# Patient Record
Sex: Male | Born: 1945 | Race: White | Hispanic: No | Marital: Married | Smoking: Current every day smoker
Health system: Southern US, Community
[De-identification: ages and names within clinical notes are randomized; demographics above are authoritative.]

## PROBLEM LIST (undated history)

## (undated) DIAGNOSIS — G4730 Sleep apnea, unspecified: Secondary | ICD-10-CM

## (undated) DIAGNOSIS — F4310 Post-traumatic stress disorder, unspecified: Secondary | ICD-10-CM

## (undated) DIAGNOSIS — Z961 Presence of intraocular lens: Secondary | ICD-10-CM

## (undated) DIAGNOSIS — I4729 Other ventricular tachycardia: Secondary | ICD-10-CM

## (undated) DIAGNOSIS — F0390 Unspecified dementia without behavioral disturbance: Secondary | ICD-10-CM

## (undated) DIAGNOSIS — G629 Polyneuropathy, unspecified: Secondary | ICD-10-CM

## (undated) DIAGNOSIS — H409 Unspecified glaucoma: Secondary | ICD-10-CM

## (undated) DIAGNOSIS — I259 Chronic ischemic heart disease, unspecified: Secondary | ICD-10-CM

## (undated) DIAGNOSIS — H269 Unspecified cataract: Secondary | ICD-10-CM

## (undated) DIAGNOSIS — D649 Anemia, unspecified: Secondary | ICD-10-CM

## (undated) DIAGNOSIS — I472 Ventricular tachycardia, unspecified: Secondary | ICD-10-CM

## (undated) DIAGNOSIS — E119 Type 2 diabetes mellitus without complications: Secondary | ICD-10-CM

## (undated) DIAGNOSIS — I2510 Atherosclerotic heart disease of native coronary artery without angina pectoris: Secondary | ICD-10-CM

## (undated) DIAGNOSIS — K219 Gastro-esophageal reflux disease without esophagitis: Secondary | ICD-10-CM

## (undated) DIAGNOSIS — H18513 Endothelial corneal dystrophy, bilateral: Secondary | ICD-10-CM

## (undated) DIAGNOSIS — G43909 Migraine, unspecified, not intractable, without status migrainosus: Secondary | ICD-10-CM

## (undated) DIAGNOSIS — F32A Depression, unspecified: Secondary | ICD-10-CM

## (undated) DIAGNOSIS — M4800 Spinal stenosis, site unspecified: Secondary | ICD-10-CM

## (undated) DIAGNOSIS — I10 Essential (primary) hypertension: Secondary | ICD-10-CM

## (undated) DIAGNOSIS — E785 Hyperlipidemia, unspecified: Secondary | ICD-10-CM

## (undated) HISTORY — DX: Spinal stenosis, site unspecified: M48.00

## (undated) HISTORY — DX: Other ventricular tachycardia: I47.29

## (undated) HISTORY — DX: Hyperlipidemia, unspecified: E78.5

## (undated) HISTORY — DX: Type 2 diabetes mellitus without complications: E11.9

## (undated) HISTORY — DX: Unspecified dementia, unspecified severity, without behavioral disturbance, psychotic disturbance, mood disturbance, and anxiety: F03.90

## (undated) HISTORY — DX: Essential (primary) hypertension: I10

## (undated) HISTORY — DX: Atherosclerotic heart disease of native coronary artery without angina pectoris: I25.10

## (undated) HISTORY — DX: Gastro-esophageal reflux disease without esophagitis: K21.9

## (undated) HISTORY — DX: Chronic ischemic heart disease, unspecified: I25.9

## (undated) HISTORY — DX: Sleep apnea, unspecified: G47.30

## (undated) HISTORY — DX: Anemia, unspecified: D64.9

## (undated) HISTORY — DX: Endothelial corneal dystrophy, bilateral: H18.513

## (undated) HISTORY — DX: Unspecified cataract: H26.9

## (undated) HISTORY — DX: Unspecified glaucoma: H40.9

## (undated) HISTORY — PX: HERNIA REPAIR: SHX51

## (undated) HISTORY — DX: Depression, unspecified: F32.A

## (undated) HISTORY — DX: Migraine, unspecified, not intractable, without status migrainosus: G43.909

## (undated) HISTORY — DX: Polyneuropathy, unspecified: G62.9

## (undated) HISTORY — PX: BACK SURGERY: SHX140

## (undated) HISTORY — DX: Ventricular tachycardia, unspecified: I47.20

## (undated) HISTORY — DX: Post-traumatic stress disorder, unspecified: F43.10

## (undated) HISTORY — DX: Presence of intraocular lens: Z96.1

## (undated) HISTORY — PX: SHOULDER ARTHROSCOPY: SHX128

---

## 2000-11-01 ENCOUNTER — Ambulatory Visit (HOSPITAL_BASED_OUTPATIENT_CLINIC_OR_DEPARTMENT_OTHER): Admission: RE | Admit: 2000-11-01 | Discharge: 2000-11-01 | Payer: Self-pay | Admitting: Orthopaedic Surgery

## 2011-02-15 ENCOUNTER — Other Ambulatory Visit: Payer: Self-pay | Admitting: Family Medicine

## 2011-02-15 DIAGNOSIS — M545 Low back pain: Secondary | ICD-10-CM

## 2011-02-16 ENCOUNTER — Ambulatory Visit
Admission: RE | Admit: 2011-02-16 | Discharge: 2011-02-16 | Disposition: A | Discharge status: Home / Self Care | Payer: PRIVATE HEALTH INSURANCE | Source: Ambulatory Visit | Attending: Family Medicine | Admitting: Family Medicine

## 2011-02-16 VITALS — BP 100/74 | HR 57

## 2011-02-16 DIAGNOSIS — M545 Low back pain: Secondary | ICD-10-CM

## 2011-02-16 DIAGNOSIS — M549 Dorsalgia, unspecified: Secondary | ICD-10-CM

## 2011-02-16 MED ORDER — IOHEXOL 180 MG/ML  SOLN
15.0000 mL | Freq: Once | INTRAMUSCULAR | Status: AC | PRN
  Administered 2011-02-16: 15 mL via INTRATHECAL

## 2011-02-16 MED ORDER — DIAZEPAM 2 MG PO TABS
10.0000 mg | ORAL_TABLET | Freq: Once | ORAL | Status: AC
  Administered 2011-02-16: 10 mg via ORAL

## 2011-02-16 NOTE — Progress Notes (Signed)
Pt returned post myelo to nursing area. Comfortable and taking po's well.

## 2011-02-17 ENCOUNTER — Telehealth: Payer: Self-pay | Admitting: Radiology

## 2011-02-17 NOTE — Telephone Encounter (Signed)
Pt called to schedule f/u for his myelo. Called him back and told him to call Dr. Deatra James for that appointment.

## 2012-07-26 IMAGING — RF DG MYELOGRAM LUMBAR
12 of 16 series · 12 of 16 positions shown · IV contrast (omnipaque)
Comparison: None.

CLINICAL DATA: Bilateral lower extremity pain, left greater than
right.  Status post previous lumbar fusion.

MYELOGRAM INJECTION
TECHNIQUE: Informed consent was obtained from the patient prior to
the procedure, including potential complications of headache,
allergy, infection and pain.  A timeout procedure was performed.
With the patient prone, the lower back was prepped with Betadine.
1% Lidocaine was used for local anesthesia.  Lumbar puncture was
performed at the right paramidline L3-4 level using a 22 gauge
needle with return of clear CSF.  15 ml of Omnipaque 056was
injected into the subarachnoid space .
TECHNIQUE: Following injection of intrathecal Omnipaque contrast,
spine imaging in multiple projections was performed using
fluoroscopy.
Fluoroscopy Time: 1.02 minutes.
TECHNIQUE: CT imaging of the lumbar spine was performed after
intrathecal contrast administration.  Multiplanar CT image
reconstructions were also generated.

[Series 1: (hospital) · 1 of 1 slices shown]
[im 1/1]
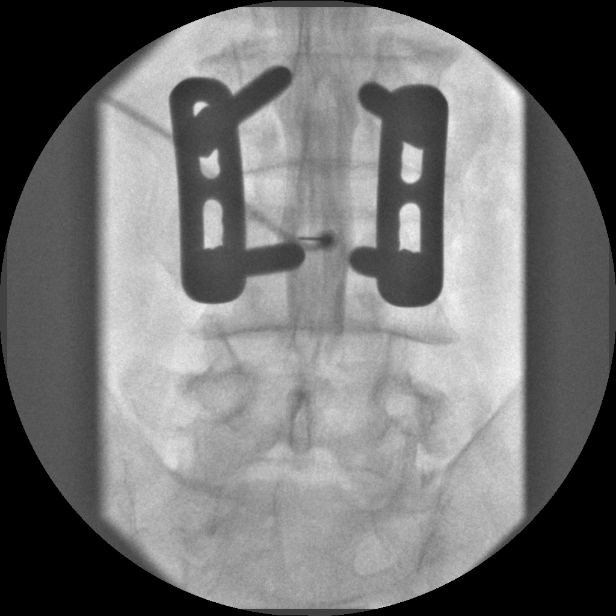

[Series 3: myelogram  white · 1 of 1 slices shown (1 of 11)]
[im 1/1]
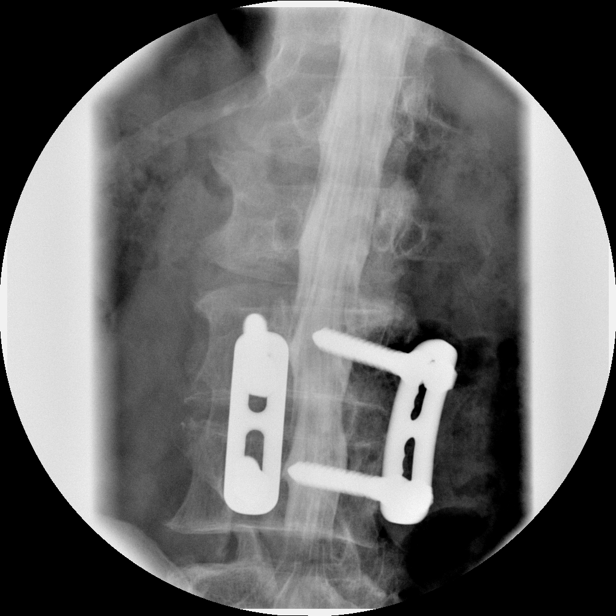

[Series 5: myelogram  white · 1 of 1 slices shown (2 of 11)]
[im 1/1]
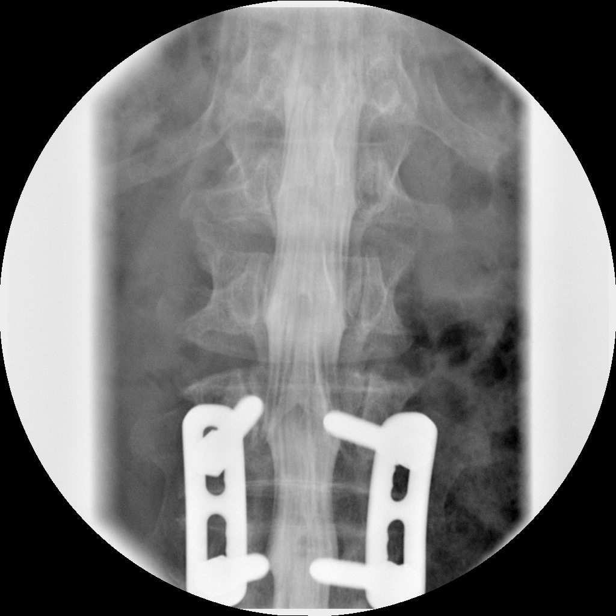

[Series 8: myelogram  white · 1 of 1 slices shown (3 of 11)]
[im 1/1]
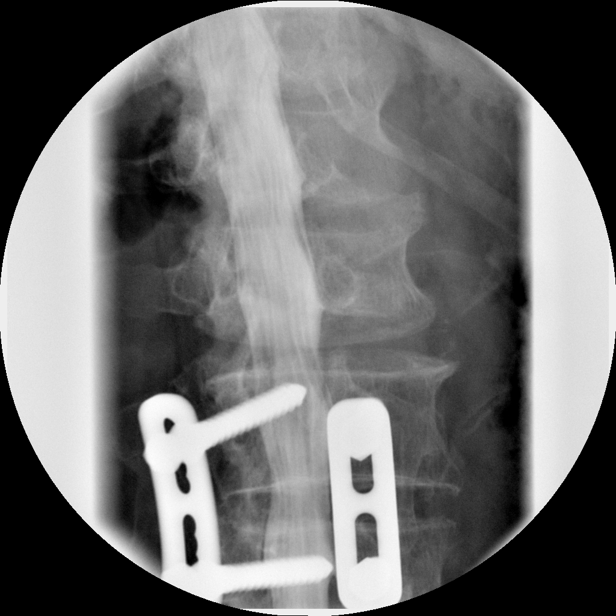

[Series 11: myelogram  white · 1 of 1 slices shown (4 of 11)]
[im 1/1]
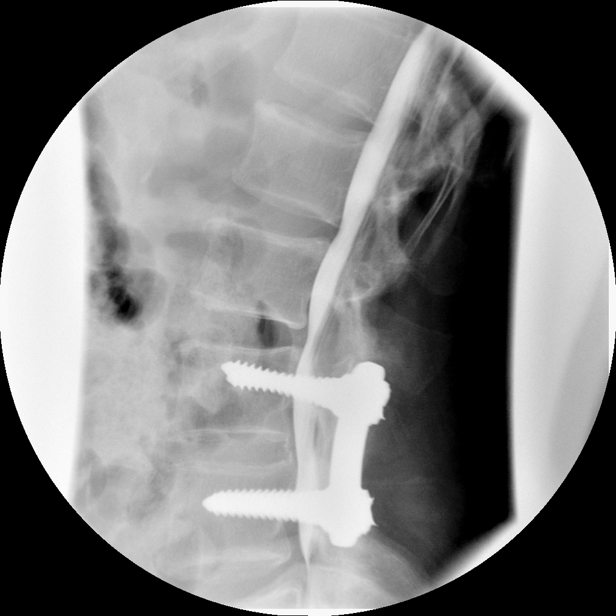

[Series 12: myelogram  white · 1 of 1 slices shown (5 of 11)]
[im 1/1]
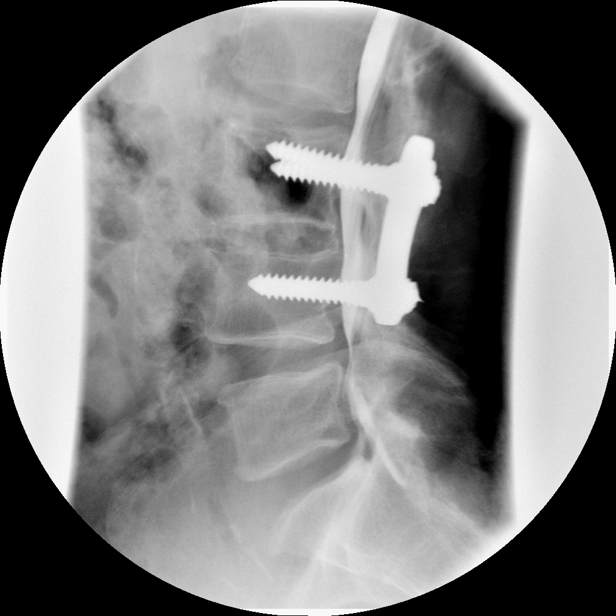

[Series 13: myelogram  white · 1 of 1 slices shown (6 of 11)]
[im 1/1]
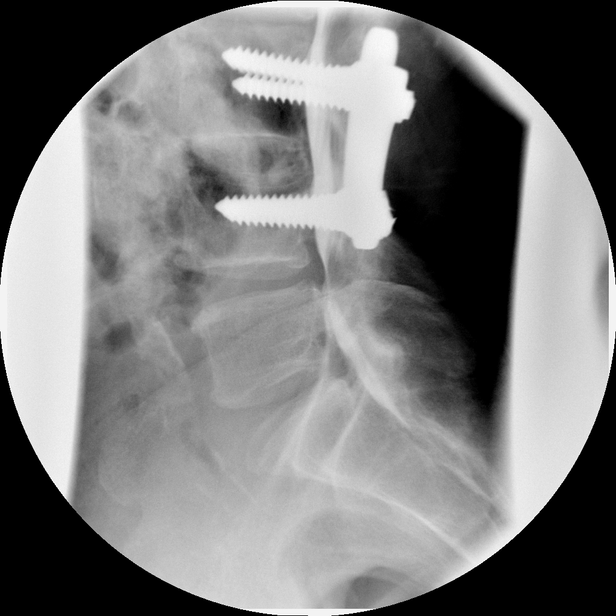

[Series 15: myelogram  white · 1 of 1 slices shown (7 of 11)]
[im 1/1]
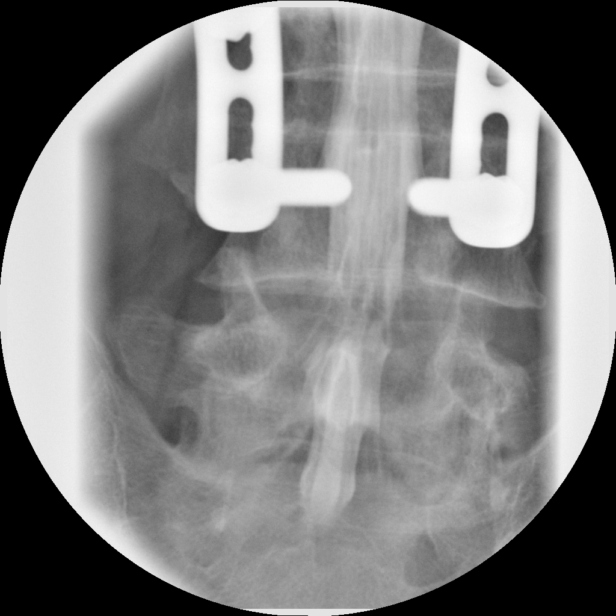

[Series 16: myelogram  white · 1 of 1 slices shown (8 of 11)]
[im 1/1]
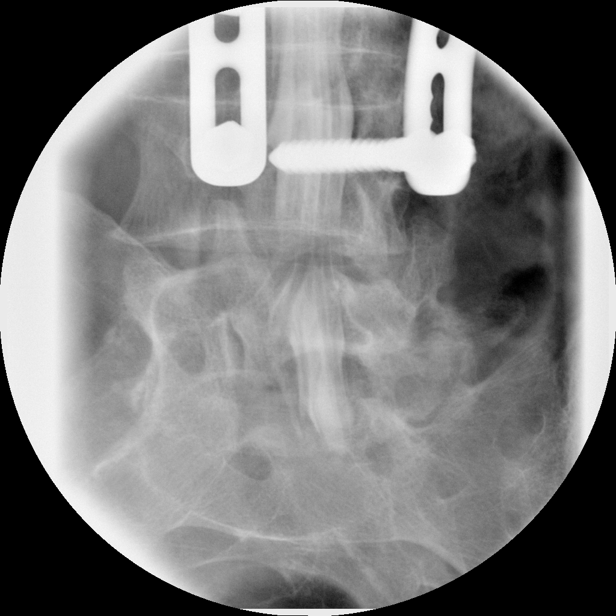

[Series 17: myelogram  white · 1 of 1 slices shown (9 of 11)]
[im 1/1]
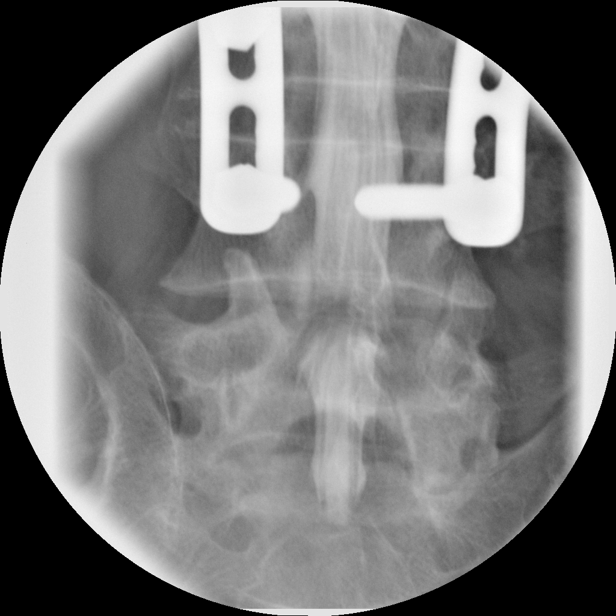

[Series 19: myelogram  white · 1 of 1 slices shown (10 of 11)]
[im 1/1]
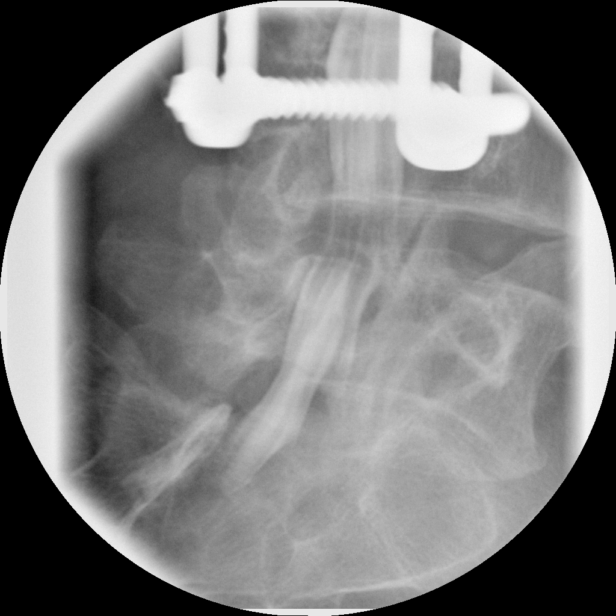

[Series 20: myelogram  white · 1 of 1 slices shown (11 of 11)]
[im 1/1]
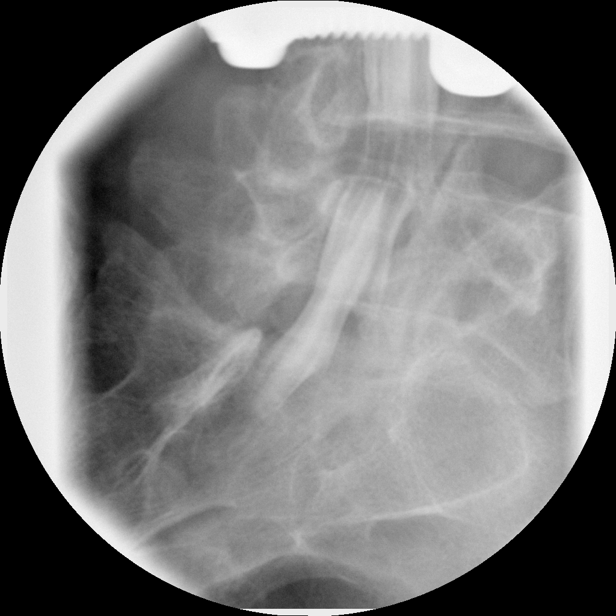

[12 of 16 positions shown; findings below may reference images not displayed]

IMPRESSION: Successful injection of  intrathecal contrast for myelography.

MYELOGRAM LUMBAR
FINDINGS: The patient is status post posterior fusion at L3-4.
Five non-rib bearing lumbar type vertebral bodies are present.  The
upper lumbar nerve roots fill normally.  Vertebral body heights and
alignment are maintained.  Slight disc bulging is present at L2-3.
There is no significant stenosis at the fused segment, L3-4.

A waist deformity is present at L4-5, suggesting adjacent level
stenosis.  There is medial deviation of the traversing L5 nerve
roots.  There is impact greater left than right.

The upright images demonstrate slight increase in disc bulging and
possible narrowing at L2-3.  Alignment is maintained at L4-5 with
slight increase in the disc bulge.  There is no abnormal motion
with flexion or extension.  The fused segments are stable.
IMPRESSION: 1.  Status post posterior fusion at L3-4.
2.  Adjacent level stenosis at L4-5 with left greater than right
lateral recess narrowing.
3.  Mild narrowing at L2-3 is most evident on the upright images.
4.  Slight increase in disc bulging at L4-5 the upright images as
well without abnormal motion during flexion or extension.


CT MYELOGRAPHY LUMBAR SPINE
FINDINGS: The lumbar spine is imaged from T11-12 through S2-3.
The patient is status post posterior fusion with pedicle screw and
rod fixation at L3-4.  There is solid osseous fusion across the
posterior elements.  There may be some bridging bone at the
posterior aspect of the disc space.  The patient does not appear to
have had a discectomy.  Limited imaging of the abdomen is
unremarkable.  The individual disc levels are as follows.

The disc levels at T12-L1 and above are normal.

L1-2:  Minimal disc bulging is present. Mild facet hypertrophy is
evident as well. No significant stenosis is present.

L2-3:  Broad-based disc bulging is present.  Moderate facet
hypertrophy and ligamentum flavum thickening is present.  There is
a vacuum phenomenon within the facet joints bilaterally. Mild right
lateral recess and foraminal narrowing is present.

L3-4:  The patient is status post fusion at this level.  No
significant central or foraminal stenosis is present.  A wide
laminectomy was performed.

L4-5:  Moderate central canal stenosis is secondary to broad-based
disc bulging and advanced facet hypertrophy.  A vacuum phenomenon
is evident within the facet joints bilaterally.  The lateral recess
narrowing is worse on the left.  Mild foraminal narrowing is seen
bilaterally.

L5-S1:  Mild broad-based disc bulging is present.  The facet
hypertrophy contributes to mild lateral recess and foraminal
narrowing bilaterally.
IMPRESSION: 1.  Status post laminectomy and posterior fusion at L3-4.  No
residual stenosis is evident.
2.  Moderate central canal stenosis at L4-5 with mild foraminal
narrowing bilaterally.  The lateral recess narrowing is worse on
the left.
3.  Mild central and foraminal narrowing bilaterally at L5-S1.
4.  Advanced facet hypertrophy at L2-3 and L4-5.
5.  Mild right lateral recess and foraminal narrowing at L2-3.

## 2021-03-30 ENCOUNTER — Ambulatory Visit: Payer: PRIVATE HEALTH INSURANCE | Attending: Internal Medicine

## 2021-03-30 DIAGNOSIS — Z23 Encounter for immunization: Secondary | ICD-10-CM

## 2021-03-30 NOTE — Progress Notes (Signed)
   Covid-19 Vaccination Clinic  Name:  Benjamin Boyle    MRN: [id number] DOB: Oct 31, 1945  03/30/2021  [name] was observed post Covid-19 immunization for 15 minutes without incident. He was provided with Vaccine Information Sheet and instruction to access the V-Safe system.   [name] was instructed to call [phone] with any severe reactions post vaccine: Difficulty breathing  Swelling of face and throat  A fast heartbeat  A bad rash all over body  Dizziness and weakness

## 2021-04-07 ENCOUNTER — Other Ambulatory Visit (HOSPITAL_BASED_OUTPATIENT_CLINIC_OR_DEPARTMENT_OTHER): Payer: Self-pay

## 2021-04-07 MED ORDER — COVID-19MRNA BIVAL VACC PFIZER 30 MCG/0.3ML IM SUSP
INTRAMUSCULAR | 0 refills | Status: DC
  Filled 2021-04-07: qty 0.3, 1d supply, fill #0

## 2021-08-26 ENCOUNTER — Inpatient Hospital Stay: Payer: PRIVATE HEALTH INSURANCE

## 2021-08-26 ENCOUNTER — Encounter: Payer: PRIVATE HEALTH INSURANCE | Admitting: Hematology and Oncology

## 2021-08-27 ENCOUNTER — Other Ambulatory Visit: Payer: Self-pay | Admitting: Hematology and Oncology

## 2021-08-27 DIAGNOSIS — D509 Iron deficiency anemia, unspecified: Secondary | ICD-10-CM

## 2021-08-28 ENCOUNTER — Encounter: Payer: Self-pay | Admitting: Hematology and Oncology

## 2021-08-28 ENCOUNTER — Inpatient Hospital Stay: Payer: No Typology Code available for payment source | Attending: Hematology and Oncology

## 2021-08-28 ENCOUNTER — Other Ambulatory Visit: Payer: Self-pay

## 2021-08-28 ENCOUNTER — Inpatient Hospital Stay (INDEPENDENT_AMBULATORY_CARE_PROVIDER_SITE_OTHER): Payer: No Typology Code available for payment source | Admitting: Hematology and Oncology

## 2021-08-28 DIAGNOSIS — D509 Iron deficiency anemia, unspecified: Secondary | ICD-10-CM | POA: Insufficient documentation

## 2021-08-28 LAB — CBC: RBC: 3.72 — AB (ref 3.87–5.11)

## 2021-08-28 LAB — CBC AND DIFFERENTIAL
HCT: 32 — AB (ref 41–53)
Hemoglobin: 10.3 — AB (ref 13.5–17.5)
Neutrophils Absolute: 4.54
Platelets: 254 10*3/uL (ref 150–400)
WBC: 7.1

## 2021-08-28 LAB — IRON AND TIBC
Iron: 30 ug/dL — ABNORMAL LOW (ref 45–182)
Saturation Ratios: 7 % — ABNORMAL LOW (ref 17.9–39.5)
TIBC: 438 ug/dL (ref 250–450)
UIBC: 408 ug/dL

## 2021-08-28 LAB — FERRITIN: Ferritin: 9 ng/mL — ABNORMAL LOW (ref 24–336)

## 2021-08-28 NOTE — Progress Notes (Cosign Needed)
[facility]  [address] [place],  [place]  [address] [phone]  Clinic Day:  08/28/2021  Referring physician: [place]:    HISTORY OF PRESENT ILLNESS:  Benjamin Boyle is a 76 y.o. male with a history of iron deficiency anemia who is referred in consultation by [facility] for assessment and management. He has been managed for quite some time with oral iron; however, he has significant constipation with it. He was referred to [place] for possible IV iron infusions.   Today, he denies fever, chills, nausea or vomiting. He denies shortness of breath, chest pain or cough. He denies current issues with bowel or bladder. His appetite is good and his weight is stable. 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 Surgical history includes hernia repair, back surgeries x 2 and right shoulder surgery. Family medical history includes diabetes and heart disease.    REVIEW OF SYSTEMS:  Review of Systems  Constitutional:  Negative for appetite change, chills, diaphoresis, fatigue, fever and unexpected weight change.  HENT:   Negative for hearing loss, lump/mass, mouth sores, nosebleeds, sore throat, tinnitus, trouble swallowing and voice change.   Eyes:  Negative for eye problems and icterus.  Respiratory:  Negative for chest tightness, cough, hemoptysis, shortness of breath and wheezing.   Cardiovascular:  Negative for chest pain, leg swelling and palpitations.  Gastrointestinal:  Positive for constipation. Negative for abdominal distention, abdominal pain, blood in stool, diarrhea, nausea, rectal pain and vomiting.  Endocrine: Negative for hot flashes.  Genitourinary:  Negative for bladder incontinence, difficulty urinating, dyspareunia, dysuria, frequency,  hematuria and nocturia.   Musculoskeletal:  Positive for arthralgias, back pain, gait problem and myalgias. Negative for flank pain, neck pain and neck stiffness.  Skin:  Negative for itching, rash and wound.  Neurological:  Positive for extremity weakness and gait problem. Negative for dizziness, headaches, light-headedness, numbness, seizures and speech difficulty.  Hematological:  Negative for adenopathy. Does not bruise/bleed easily.  Psychiatric/Behavioral:  Negative for confusion, decreased concentration, depression, sleep disturbance and suicidal ideas. The patient is not nervous/anxious.     VITALS:  Blood pressure (!) 143/68, pulse 60, temperature 97.7 F (36.5 C), temperature source Oral, resp. rate 18, height 5\' 7"  (1.702 m), weight 154 lb 3.2 oz (69.9 kg), SpO2 95 %.  Wt Readings from Last 3 Encounters:  08/28/21 154 lb 3.2 oz (69.9 kg)    Body mass index is 24.15 kg/m.  Performance status (ECOG): 1 - Symptomatic but completely ambulatory  PHYSICAL EXAM:  Physical Exam Constitutional:      General: He is not in acute distress.    Appearance: Normal appearance. He is normal weight. He is not ill-appearing, toxic-appearing or diaphoretic.  HENT:     Head: Normocephalic and atraumatic.     Right Ear: Tympanic membrane normal.     Left Ear: Tympanic membrane normal.     Nose: Nose normal. No congestion or rhinorrhea.     Mouth/Throat:     Mouth: Mucous membranes are moist.     Pharynx: Oropharynx is clear. No oropharyngeal exudate or posterior oropharyngeal erythema.  Eyes:     General: No scleral icterus.       Right eye: No discharge.        Left eye: No discharge.     Extraocular Movements: Extraocular movements intact.  Conjunctiva/sclera: Conjunctivae normal.     Pupils: Pupils are equal, round, and reactive to light.  Neck:     Vascular: No carotid bruit.  Cardiovascular:     Rate and Rhythm: Normal rate and regular rhythm.     Heart sounds: No murmur  heard.   No friction rub. No gallop.  Pulmonary:     Effort: Pulmonary effort is normal. No respiratory distress.     Breath sounds: Normal breath sounds. No stridor. No wheezing, rhonchi or rales.  Chest:     Chest wall: No tenderness.  Abdominal:     General: Abdomen is flat. Bowel sounds are normal. There is no distension.     Palpations: There is no mass.     Tenderness: There is no abdominal tenderness. There is no right CVA tenderness, left CVA tenderness, guarding or rebound.     Hernia: No hernia is present.  Musculoskeletal:        General: No swelling, tenderness, deformity or signs of injury. Normal range of motion.     Cervical back: Normal range of motion and neck supple. No rigidity or tenderness.     Right lower leg: No edema.     Left lower leg: No edema.  Lymphadenopathy:     Cervical: No cervical adenopathy.  Skin:    General: Skin is warm and dry.     Capillary Refill: Capillary refill takes less than 2 seconds.     Coloration: Skin is not jaundiced or pale.     Findings: No bruising, erythema, lesion or rash.  Neurological:     General: No focal deficit present.     Mental Status: He is alert and oriented to person, place, and time. Mental status is at baseline.     Cranial Nerves: No cranial nerve deficit.     Sensory: No sensory deficit.     Motor: Weakness present.     Coordination: Coordination abnormal.     Gait: Gait abnormal.     Deep Tendon Reflexes: Reflexes normal.  Psychiatric:        Mood and Affect: Mood normal.        Behavior: Behavior normal.        Thought Content: Thought content normal.        Judgment: Judgment normal.     LABS:   CBC Latest Ref Rng & Units 08/28/2021  WBC - 7.1  Hemoglobin 13.5 - 17.5 10.3(A)  Hematocrit 41 - 53 32(A)  Platelets 150 - 400 K/uL 254   No flowsheet data found.   No results found for: CEA1 / No results found for: CEA1 No results found for: PSA1 No results found for: [id number] No results found  for: CAN125  No results found for: TOTALPROTELP, ALBUMINELP, A1GS, A2GS, BETS, BETA2SER, GAMS, MSPIKE, SPEI No results found for: TIBC, FERRITIN, IRONPCTSAT No results found for: LDH  STUDIES:  No results found.    HISTORY:  No past medical history on file.    No family history on file.  Social History:  has no history on file for tobacco use, alcohol use, and drug use.The patient is accompanied by daughter today.  Allergies:  Allergies  Allergen Reactions   Codeine Nausea And Vomiting   Donepezil Diarrhea   Gabapentin     Other reaction(s): Memory impairment   Glipizide     Other reaction(s): Hypoglycemia   Zomig [Zolmitriptan]     Current Medications: Current Outpatient Medications  Medication Sig Dispense Refill   Alogliptin Benzoate  25 MG TABS TAKE ONE-HALF TABLET BY MOUTH DAILY FOR DIABETES     amLODipine (NORVASC) 10 MG tablet TAKE ONE-HALF TABLET BY MOUTH DAILY FOR HEART IN PLACE OF METOPROLOL     atorvastatin (LIPITOR) 80 MG tablet TAKE ONE-HALF TABLET BY MOUTH AT BEDTIME FOR CHOLESTEROL     cetirizine (ZYRTEC) 10 MG tablet TAKE ONE TABLET BY MOUTH EVERY MORNING FOR CONGESTION- TAKE FOR 2  WEEKS THEN STOP IF CLEARED     clonazePAM (KLONOPIN) 0.5 MG tablet TAKE TWO AND ONE-HALF TABLETS BY MOUTH AT BEDTIME - NEW, FOR JUMPING LEGS (MYOCLONUS)     losartan (COZAAR) 100 MG tablet TAKE ONE TABLET BY MOUTH EVERY MORNING FOR BLOOD PRESSURE; TAKE IN PLACE OF LISINOPRIL;  STOP THE LISINOPRIL     Menthol-Methyl Salicylate (THERA-GESIC) 0.5-15 % CREA APPLY SMALL AMOUNT TO AFFECTED AREA EVERY FOUR HOURS FOR NECK PAIN     methocarbamol (ROBAXIN) 750 MG tablet TAKE ONE TABLET BY MOUTH AT BEDTIME NECK PAIN     naproxen (NAPROSYN) 500 MG tablet TAKE ONE TABLET BY MOUTH TWICE A DAY AS NEEDED (TAKE WITH FOOD) FOR PAIN     PARoxetine (PAXIL) 40 MG tablet TAKE ONE AND ONE-HALF TABLETS BY MOUTH DAILY FOR MENTAL HEALTH     pramipexole (MIRAPEX) 1 MG tablet TAKE ONE TABLET BY MOUTH AT  BEDTIME FOR RESTLESS LEG SYNDROME, NOT A DOSE CHANGE     pregabalin (LYRICA) 100 MG capsule TAKE ONE CAPSULE BY MOUTH TWICE A DAY - INCREASED FROM 75 MG TWICE DAILY     rivastigmine (EXELON) 6 MG capsule TAKE ONE CAPSULE BY MOUTH TWICE A DAY WITH MEALS TO SLOW MEMORY LOSS. TAKE IN PLACE OF DONEPEZIL. NO CHANGE     sildenafil (VIAGRA) 100 MG tablet TAKE ONE-HALF TABLET BY MOUTH AS DIRECTED (TAKE 1 HOUR PRIOR TO SEXUAL ACTIVITY *DO NOT EXCEED 1 DOSE PER 24 HOUR PERIOD*) DO NOT TAKE SILDENAFIL ON DAYS THAT YOU TAKE TAMSULOSIN.     sodium chloride (MURO 128) 5 % ophthalmic ointment APPLY THIN RIBBON TO EACH EYE TWICE A DAY FOR CORNEAL EDEMA     tamsulosin (FLOMAX) 0.4 MG CAPS capsule TAKE ONE CAPSULE BY MOUTH ONCE A DAY     traZODone (DESYREL) 50 MG tablet Take 1 tablet by mouth at bedtime.     vitamin B-12 (CYANOCOBALAMIN) 500 MCG tablet Take 1 tablet by mouth daily.     acetaminophen (TYLENOL) 500 MG tablet Take by mouth.     COVID-19 mRNA bivalent vaccine, Pfizer, injection Inject into the muscle. 0.3 mL 0   metFORMIN (GLUCOPHAGE-XR) 500 MG 24 hr tablet Take by mouth.     No current facility-administered medications for this visit.     ASSESSMENT & PLAN:   Assessment:  Benjamin Boyle is a 76 y.o. male with history of iron deficiency anemia who is intolerant of oral iron. He reports severe constipation with oral iron and was referred by the [facility] for IV iron infusions. CBC today reveals hemoglobin 10.3. He is agreeable to iron infusion. He is scheduled for corneal transplant next week, so we will delay until the week of the 20th.  Plan: 1.  We will schedule IV Feraheme for the week of March 20th and have him return to clinic 4 weeks after final dose for evaluation.  I discussed the assessment and treatment plan with the patient.  The patient was provided an opportunity to ask questions and all were answered.  The patient agreed with the plan and demonstrated an  understanding of the instructions.   The patient was advised to call back if the symptoms worsen or if the condition fails to improve as anticipated.  Thank you for the opportunity      [name], NP

## 2021-09-04 ENCOUNTER — Encounter: Payer: Self-pay | Admitting: Hematology and Oncology

## 2021-09-04 NOTE — Addendum Note (Signed)
Addended by: Juanetta Beets on: 09/04/2021 09:29 AM ? ? Modules accepted: Orders ? ?

## 2021-09-07 ENCOUNTER — Inpatient Hospital Stay: Payer: No Typology Code available for payment source

## 2021-09-07 ENCOUNTER — Other Ambulatory Visit: Payer: Self-pay

## 2021-09-07 VITALS — BP 144/77 | HR 66 | Temp 97.9°F | Resp 18 | Ht 67.0 in | Wt 154.0 lb

## 2021-09-07 DIAGNOSIS — D509 Iron deficiency anemia, unspecified: Secondary | ICD-10-CM | POA: Diagnosis not present

## 2021-09-07 MED ORDER — SODIUM CHLORIDE 0.9 % IV SOLN
510.0000 mg | Freq: Once | INTRAVENOUS | Status: AC
  Administered 2021-09-07: 510 mg via INTRAVENOUS
  Filled 2021-09-07: qty 510

## 2021-09-07 MED ORDER — SODIUM CHLORIDE 0.9 % IV SOLN: Freq: Once | INTRAVENOUS | Status: AC

## 2021-09-07 NOTE — Patient Instructions (Signed)

## 2021-09-14 ENCOUNTER — Inpatient Hospital Stay: Payer: No Typology Code available for payment source

## 2021-09-14 ENCOUNTER — Other Ambulatory Visit: Payer: Self-pay

## 2021-09-14 VITALS — BP 134/71 | HR 52 | Temp 98.0°F | Resp 18 | Wt 156.0 lb

## 2021-09-14 DIAGNOSIS — D509 Iron deficiency anemia, unspecified: Secondary | ICD-10-CM | POA: Diagnosis not present

## 2021-09-14 MED ORDER — SODIUM CHLORIDE 0.9 % IV SOLN: Freq: Once | INTRAVENOUS | Status: AC

## 2021-09-14 MED ORDER — SODIUM CHLORIDE 0.9 % IV SOLN
510.0000 mg | Freq: Once | INTRAVENOUS | Status: AC
  Administered 2021-09-14: 510 mg via INTRAVENOUS
  Filled 2021-09-14: qty 510

## 2021-09-14 NOTE — Patient Instructions (Signed)

## 2021-09-24 ENCOUNTER — Encounter: Payer: Self-pay | Admitting: Hematology and Oncology

## 2021-10-09 ENCOUNTER — Inpatient Hospital Stay: Payer: No Typology Code available for payment source

## 2021-10-09 ENCOUNTER — Other Ambulatory Visit: Payer: Self-pay | Admitting: Hematology and Oncology

## 2021-10-09 ENCOUNTER — Inpatient Hospital Stay
Payer: No Typology Code available for payment source | Attending: Hematology and Oncology | Admitting: Hematology and Oncology

## 2021-10-09 ENCOUNTER — Other Ambulatory Visit: Payer: Self-pay

## 2021-10-09 ENCOUNTER — Encounter: Payer: Self-pay | Admitting: Hematology and Oncology

## 2021-10-09 DIAGNOSIS — D509 Iron deficiency anemia, unspecified: Secondary | ICD-10-CM

## 2021-10-09 LAB — CBC AND DIFFERENTIAL
HCT: 36 — AB (ref 41–53)
Hemoglobin: 11.5 — AB (ref 13.5–17.5)
Neutrophils Absolute: 4.82
Platelets: 231 10*3/uL (ref 150–400)
WBC: 7.3

## 2021-10-09 LAB — IRON AND TIBC
Iron: 80 ug/dL (ref 45–182)
Saturation Ratios: 28 % (ref 17.9–39.5)
TIBC: 283 ug/dL (ref 250–450)
UIBC: 203 ug/dL

## 2021-10-09 LAB — FERRITIN: Ferritin: 187 ng/mL (ref 24–336)

## 2021-10-09 LAB — CBC
MCV: 89 (ref 76–111)
RBC: 4.05 (ref 3.87–5.11)

## 2021-10-09 NOTE — Progress Notes (Cosign Needed)
?Patient Care Team: ?Clinic, [facility] as PCP - General ? ?Clinic Day:  10/09/2021 ? ?Referring physician: [name], NP ? ?ASSESSMENT & PLAN:  ? ?Assessment & Plan: ?Iron deficiency anemia ?A 76 y.o. male with history of iron deficiency anemia who is intolerant of oral iron. He reports severe constipation with oral iron and was referred by the [facility] for IV iron infusions. He received IV Feraheme 4 weeks ago without complications. Today, hemoglobin is 11.5 and he states feeling much better. Symptoms of fatigue and shortness of breath have resolved. He will return to clinic in 3 months for repeat evaluation.   ? ?The patient understands the plans discussed today and is in agreement with them.  He knows to contact our office if he develops concerns prior to his next appointment. ? ? ? ?[name], NP  ?[facility] [address] ?[place] [address] ?Dept: [phone] ?Dept Fax: [phone]  ? ?No orders of the defined types were placed in this encounter. ?  ? ? ?CHIEF COMPLAINT:  ?CC: A 76 year old male with iron deficiency anemia here for 4 week evaluation ? ?Current Treatment:  Surveillance ? ?INTERVAL HISTORY:  ?Benjamin Boyle is here today for repeat clinical assessment. He denies fevers or chills. He denies pain. His appetite is good. His weight has been stable. ? ?I have reviewed the past medical history, past surgical history, social history and family history with the patient and they are unchanged from previous note. ? ?ALLERGIES:  is allergic to codeine, donepezil, gabapentin, glipizide, and zomig [zolmitriptan]. ? ?MEDICATIONS:  ?Current Outpatient Medications  ?Medication Sig Dispense Refill  ? acetaminophen (TYLENOL) 500 MG tablet Take by mouth.    ? Alogliptin Benzoate 25 MG TABS TAKE ONE-HALF TABLET BY MOUTH DAILY FOR DIABETES    ? amLODipine (NORVASC) 10 MG tablet TAKE ONE-HALF TABLET BY MOUTH DAILY FOR HEART IN PLACE OF  METOPROLOL    ? atorvastatin (LIPITOR) 80 MG tablet TAKE ONE-HALF TABLET BY MOUTH AT BEDTIME FOR CHOLESTEROL    ? cetirizine (ZYRTEC) 10 MG tablet TAKE ONE TABLET BY MOUTH EVERY MORNING FOR CONGESTION- TAKE FOR 2  WEEKS THEN STOP IF CLEARED    ? clonazePAM (KLONOPIN) 0.5 MG tablet TAKE TWO AND ONE-HALF TABLETS BY MOUTH AT BEDTIME - NEW, FOR JUMPING LEGS (MYOCLONUS)    ? COVID-19 mRNA bivalent vaccine, Pfizer, injection Inject into the muscle. 0.3 mL 0  ? cyanocobalamin 100 MCG tablet take 1 tablet by oral route every day    ? HYDROcodone-acetaminophen (NORCO/VICODIN) 5-325 MG tablet Take 1 tablet by mouth every 6 (six) hours as needed.    ? losartan (COZAAR) 100 MG tablet TAKE ONE TABLET BY MOUTH EVERY MORNING FOR BLOOD PRESSURE; TAKE IN PLACE OF LISINOPRIL;  STOP THE LISINOPRIL    ? memantine (NAMENDA) 10 MG tablet take 2 tablets by oral route every day at bedtime to slow memory loss    ? Menthol-Methyl Salicylate (THERA-GESIC) 0.5-15 % CREA APPLY SMALL AMOUNT TO AFFECTED AREA EVERY FOUR HOURS FOR NECK PAIN    ? metFORMIN (GLUCOPHAGE-XR) 500 MG 24 hr tablet Take by mouth.    ? methocarbamol (ROBAXIN) 750 MG tablet TAKE ONE TABLET BY MOUTH AT BEDTIME NECK PAIN    ? moxifloxacin (VIGAMOX) 0.5 % ophthalmic solution INSTILL 1 DROP IN LEFT EYE FOUR TIMES A DAY FOR EYE PROCEDURE    ? naproxen (NAPROSYN) 500 MG tablet TAKE ONE TABLET BY MOUTH TWICE A DAY  AS NEEDED (TAKE WITH FOOD) FOR PAIN    ? neomycin-polymyxin-dexameth (MAXITROL) 0.1 % OINT APPLY THIN RIBBON TO LEFT EYE AT BEDTIME FOR EYE PROCEDURE    ? ondansetron (ZOFRAN-ODT) 4 MG disintegrating tablet Take 4 mg by mouth every 8 (eight) hours as needed.    ? PARoxetine (PAXIL) 40 MG tablet TAKE ONE AND ONE-HALF TABLETS BY MOUTH DAILY FOR MENTAL HEALTH    ? pramipexole (MIRAPEX) 1 MG tablet TAKE ONE TABLET BY MOUTH AT BEDTIME FOR RESTLESS LEG SYNDROME, NOT A DOSE CHANGE    ? prednisoLONE acetate (PRED FORTE) 1 % ophthalmic suspension INSTILL 1 DROP IN LEFT EYE FOUR  TIMES A DAY PLEASE DISPENSE 10 ML BOTTLE, IDEALLY PINK TOP IF AVAILABLE.    ? pregabalin (LYRICA) 100 MG capsule TAKE ONE CAPSULE BY MOUTH TWICE A DAY - INCREASED FROM 75 MG TWICE DAILY    ? rivastigmine (EXELON) 6 MG capsule TAKE ONE CAPSULE BY MOUTH TWICE A DAY WITH MEALS TO SLOW MEMORY LOSS. TAKE IN PLACE OF DONEPEZIL. NO CHANGE    ? sildenafil (VIAGRA) 100 MG tablet TAKE ONE-HALF TABLET BY MOUTH AS DIRECTED (TAKE 1 HOUR PRIOR TO SEXUAL ACTIVITY *DO NOT EXCEED 1 DOSE PER 24 HOUR PERIOD*) DO NOT TAKE SILDENAFIL ON DAYS THAT YOU TAKE TAMSULOSIN.    ? sodium chloride (MURO 128) 5 % ophthalmic ointment APPLY THIN RIBBON TO EACH EYE TWICE A DAY FOR CORNEAL EDEMA    ? tamsulosin (FLOMAX) 0.4 MG CAPS capsule TAKE ONE CAPSULE BY MOUTH ONCE A DAY    ? traZODone (DESYREL) 50 MG tablet Take 1 tablet by mouth at bedtime.    ? vitamin B-12 (CYANOCOBALAMIN) 500 MCG tablet Take 1 tablet by mouth daily.    ? ?No current facility-administered medications for this visit.  ? ? ?HISTORY OF PRESENT ILLNESS:  ? ?Oncology History  ? No history exists.  ?  ? ? ?REVIEW OF SYSTEMS:  ? ?Constitutional: Denies fevers, chills or abnormal weight loss ?Eyes: Denies blurriness of vision ?Ears, nose, mouth, throat, and face: Denies mucositis or sore throat ?Respiratory: Denies cough, dyspnea or wheezes ?Cardiovascular: Denies palpitation, chest discomfort or lower extremity swelling ?Gastrointestinal:  Denies nausea, heartburn or change in bowel habits ?Skin: Denies abnormal skin rashes ?Lymphatics: Denies new lymphadenopathy or easy bruising ?Neurological:Denies numbness, tingling or new weaknesses ?Behavioral/Psych: Mood is stable, no new changes  ?All other systems were reviewed with the patient and are negative. ? ? ?VITALS:  ?Blood pressure 120/62, pulse 67, temperature 98.4 ?F (36.9 ?C), temperature source Oral, resp. rate 16, weight 149 lb 4.8 oz (67.7 kg).  ?Wt Readings from Last 3 Encounters:  ?10/09/21 149 lb 4.8 oz (67.7 kg)   ?09/14/21 156 lb (70.8 kg)  ?09/07/21 154 lb (69.9 kg)  ?  ?Body mass index is 23.38 kg/m?. ? ?Performance status (ECOG): 1 - Symptomatic but completely ambulatory ? ?PHYSICAL EXAM:  ? ?GENERAL:alert, no distress and comfortable ?SKIN: skin color, texture, turgor are normal, no rashes or significant lesions ?EYES: normal, Conjunctiva are pink and non-injected, sclera clear ?OROPHARYNX:no exudate, no erythema and lips, buccal mucosa, and tongue normal  ?NECK: supple, thyroid normal size, non-tender, without nodularity ?LYMPH:  no palpable lymphadenopathy in the cervical, axillary or inguinal ?LUNGS: clear to auscultation and percussion with normal breathing effort ?HEART: regular rate & rhythm and no murmurs and no lower extremity edema ?ABDOMEN:abdomen soft, non-tender and normal bowel sounds ?Musculoskeletal:no cyanosis of digits and no clubbing  ?NEURO: alert & oriented x 3 with fluent  speech, no focal motor/sensory deficits ? ?LABORATORY DATA:  ?I have reviewed the data as listed ?No results found for: NA, K, CL, CO2, GLUCOSE, BUN, CREATININE, CALCIUM, PROT, ALBUMIN, AST, ALT, ALKPHOS, BILITOT, GFRNONAA, GFRAA ? ?No results found for: SPEP, UPEP ? ?Lab Results  ?Component Value Date  ? WBC 7.3 10/09/2021  ? NEUTROABS 4.82 10/09/2021  ? HGB 11.5 (A) 10/09/2021  ? HCT 36 (A) 10/09/2021  ? MCV 89 10/09/2021  ? PLT 231 10/09/2021  ? ? ?  Chemistry   ?No results found for: NA, K, CL, CO2, BUN, CREATININE, GLU No results found for: CALCIUM, ALKPHOS, AST, ALT, BILITOT  ? ? ? ?RADIOGRAPHIC STUDIES: ?I have personally reviewed the radiological images as listed and agreed with the findings in the report. ?No results found. ?

## 2021-10-09 NOTE — Assessment & Plan Note (Signed)
A 76 y.o. male with history of iron deficiency anemia who is intolerant of oral iron. He reports severe constipation with oral iron and was referred by the [facility] for IV iron infusions. He received IV Feraheme 4 weeks ago without complications. Today, hemoglobin is 11.5 and he states feeling much better. Symptoms of fatigue and shortness of breath have resolved. He will return to clinic in 3 months for repeat evaluation.  ?

## 2021-11-27 ENCOUNTER — Other Ambulatory Visit: Payer: Self-pay | Admitting: Student

## 2021-11-27 DIAGNOSIS — S12120A Other displaced dens fracture, initial encounter for closed fracture: Secondary | ICD-10-CM

## 2021-12-21 ENCOUNTER — Encounter (HOSPITAL_COMMUNITY): Payer: Self-pay | Admitting: Family Medicine

## 2021-12-21 ENCOUNTER — Emergency Department (HOSPITAL_COMMUNITY): Payer: No Typology Code available for payment source

## 2021-12-21 ENCOUNTER — Other Ambulatory Visit: Payer: Self-pay

## 2021-12-21 ENCOUNTER — Inpatient Hospital Stay (HOSPITAL_COMMUNITY)
Admission: EM | Admit: 2021-12-21 | Discharge: 2021-12-30 | DRG: 871 | Disposition: A | Discharge status: Skilled Nursing Facility | Payer: No Typology Code available for payment source | Attending: Internal Medicine | Admitting: Internal Medicine

## 2021-12-21 DIAGNOSIS — S12120S Other displaced dens fracture, sequela: Secondary | ICD-10-CM

## 2021-12-21 DIAGNOSIS — E876 Hypokalemia: Secondary | ICD-10-CM | POA: Diagnosis present

## 2021-12-21 DIAGNOSIS — A419 Sepsis, unspecified organism: Principal | ICD-10-CM | POA: Diagnosis present

## 2021-12-21 DIAGNOSIS — F0390 Unspecified dementia without behavioral disturbance: Secondary | ICD-10-CM | POA: Diagnosis present

## 2021-12-21 DIAGNOSIS — Z79899 Other long term (current) drug therapy: Secondary | ICD-10-CM | POA: Diagnosis not present

## 2021-12-21 DIAGNOSIS — I2510 Atherosclerotic heart disease of native coronary artery without angina pectoris: Secondary | ICD-10-CM | POA: Diagnosis present

## 2021-12-21 DIAGNOSIS — Z20822 Contact with and (suspected) exposure to covid-19: Secondary | ICD-10-CM | POA: Diagnosis present

## 2021-12-21 DIAGNOSIS — F4310 Post-traumatic stress disorder, unspecified: Secondary | ICD-10-CM | POA: Diagnosis present

## 2021-12-21 DIAGNOSIS — W19XXXD Unspecified fall, subsequent encounter: Secondary | ICD-10-CM | POA: Diagnosis present

## 2021-12-21 DIAGNOSIS — E1122 Type 2 diabetes mellitus with diabetic chronic kidney disease: Secondary | ICD-10-CM | POA: Diagnosis present

## 2021-12-21 DIAGNOSIS — I10 Essential (primary) hypertension: Secondary | ICD-10-CM | POA: Diagnosis present

## 2021-12-21 DIAGNOSIS — R338 Other retention of urine: Secondary | ICD-10-CM | POA: Clinically undetermined

## 2021-12-21 DIAGNOSIS — J9601 Acute respiratory failure with hypoxia: Secondary | ICD-10-CM | POA: Diagnosis present

## 2021-12-21 DIAGNOSIS — N182 Chronic kidney disease, stage 2 (mild): Secondary | ICD-10-CM | POA: Diagnosis present

## 2021-12-21 DIAGNOSIS — K2980 Duodenitis without bleeding: Secondary | ICD-10-CM | POA: Diagnosis present

## 2021-12-21 DIAGNOSIS — Z888 Allergy status to other drugs, medicaments and biological substances status: Secondary | ICD-10-CM

## 2021-12-21 DIAGNOSIS — Y92009 Unspecified place in unspecified non-institutional (private) residence as the place of occurrence of the external cause: Secondary | ICD-10-CM | POA: Diagnosis not present

## 2021-12-21 DIAGNOSIS — R339 Retention of urine, unspecified: Secondary | ICD-10-CM | POA: Diagnosis present

## 2021-12-21 DIAGNOSIS — R7401 Elevation of levels of liver transaminase levels: Secondary | ICD-10-CM | POA: Diagnosis present

## 2021-12-21 DIAGNOSIS — J189 Pneumonia, unspecified organism: Secondary | ICD-10-CM | POA: Diagnosis present

## 2021-12-21 DIAGNOSIS — M6282 Rhabdomyolysis: Secondary | ICD-10-CM | POA: Diagnosis present

## 2021-12-21 DIAGNOSIS — S12110D Anterior displaced Type II dens fracture, subsequent encounter for fracture with routine healing: Secondary | ICD-10-CM | POA: Diagnosis not present

## 2021-12-21 DIAGNOSIS — Z833 Family history of diabetes mellitus: Secondary | ICD-10-CM | POA: Diagnosis not present

## 2021-12-21 DIAGNOSIS — R5381 Other malaise: Secondary | ICD-10-CM | POA: Diagnosis present

## 2021-12-21 DIAGNOSIS — Z885 Allergy status to narcotic agent status: Secondary | ICD-10-CM

## 2021-12-21 DIAGNOSIS — I129 Hypertensive chronic kidney disease with stage 1 through stage 4 chronic kidney disease, or unspecified chronic kidney disease: Secondary | ICD-10-CM | POA: Diagnosis present

## 2021-12-21 DIAGNOSIS — Z8249 Family history of ischemic heart disease and other diseases of the circulatory system: Secondary | ICD-10-CM | POA: Diagnosis not present

## 2021-12-21 DIAGNOSIS — F32A Depression, unspecified: Secondary | ICD-10-CM | POA: Diagnosis present

## 2021-12-21 DIAGNOSIS — K219 Gastro-esophageal reflux disease without esophagitis: Secondary | ICD-10-CM | POA: Diagnosis present

## 2021-12-21 DIAGNOSIS — H409 Unspecified glaucoma: Secondary | ICD-10-CM | POA: Diagnosis present

## 2021-12-21 DIAGNOSIS — E785 Hyperlipidemia, unspecified: Secondary | ICD-10-CM | POA: Diagnosis present

## 2021-12-21 DIAGNOSIS — W19XXXA Unspecified fall, initial encounter: Secondary | ICD-10-CM | POA: Diagnosis not present

## 2021-12-21 DIAGNOSIS — Z7984 Long term (current) use of oral hypoglycemic drugs: Secondary | ICD-10-CM

## 2021-12-21 DIAGNOSIS — J188 Other pneumonia, unspecified organism: Secondary | ICD-10-CM | POA: Diagnosis present

## 2021-12-21 DIAGNOSIS — F17210 Nicotine dependence, cigarettes, uncomplicated: Secondary | ICD-10-CM | POA: Diagnosis present

## 2021-12-21 DIAGNOSIS — E119 Type 2 diabetes mellitus without complications: Secondary | ICD-10-CM

## 2021-12-21 LAB — COMPREHENSIVE METABOLIC PANEL
ALT: 95 U/L — ABNORMAL HIGH (ref 0–44)
AST: 149 U/L — ABNORMAL HIGH (ref 15–41)
Albumin: 2.5 g/dL — ABNORMAL LOW (ref 3.5–5.0)
Alkaline Phosphatase: 50 U/L (ref 38–126)
Anion gap: 12 (ref 5–15)
BUN: 23 mg/dL (ref 8–23)
CO2: 22 mmol/L (ref 22–32)
Calcium: 8.7 mg/dL — ABNORMAL LOW (ref 8.9–10.3)
Chloride: 101 mmol/L (ref 98–111)
Creatinine, Ser: 1.14 mg/dL (ref 0.61–1.24)
GFR, Estimated: >60 mL/min (ref >60)
Glucose, Bld: 130 mg/dL — ABNORMAL HIGH (ref 70–99)
Potassium: 2.9 mmol/L — ABNORMAL LOW (ref 3.5–5.1)
Sodium: 135 mmol/L (ref 135–145)
Total Bilirubin: 0.9 mg/dL (ref 0.3–1.2)
Total Protein: 5.6 g/dL — ABNORMAL LOW (ref 6.5–8.1)

## 2021-12-21 LAB — URINALYSIS, COMPLETE (UACMP) WITH MICROSCOPIC
Bacteria, UA: NONE SEEN
Bilirubin Urine: NEGATIVE
Glucose, UA: NEGATIVE mg/dL
Ketones, ur: 5 mg/dL — AB
Leukocytes,Ua: NEGATIVE
Nitrite: NEGATIVE
Protein, ur: 100 mg/dL — AB
Specific Gravity, Urine: 1.039 — ABNORMAL HIGH (ref 1.005–1.030)
pH: 5 (ref 5.0–8.0)

## 2021-12-21 LAB — CBC WITH DIFFERENTIAL/PLATELET
Abs Immature Granulocytes: 0.06 10*3/uL (ref 0.00–0.07)
Basophils Absolute: 0.1 10*3/uL (ref 0.0–0.1)
Basophils Relative: 1 %
Eosinophils Absolute: 0 10*3/uL (ref 0.0–0.5)
Eosinophils Relative: 0 %
HCT: 38.2 % — ABNORMAL LOW (ref 39.0–52.0)
Hemoglobin: 12.7 g/dL — ABNORMAL LOW (ref 13.0–17.0)
Immature Granulocytes: 1 %
Lymphocytes Relative: 5 %
Lymphs Abs: 0.4 10*3/uL — ABNORMAL LOW (ref 0.7–4.0)
MCH: 30.3 pg (ref 26.0–34.0)
MCHC: 33.2 g/dL (ref 30.0–36.0)
MCV: 91.2 fL (ref 80.0–100.0)
Monocytes Absolute: 0.4 10*3/uL (ref 0.1–1.0)
Monocytes Relative: 6 %
Neutro Abs: 6.4 10*3/uL (ref 1.7–7.7)
Neutrophils Relative %: 87 %
Platelets: 173 10*3/uL (ref 150–400)
RBC: 4.19 MIL/uL — ABNORMAL LOW (ref 4.22–5.81)
RDW: 13.4 % (ref 11.5–15.5)
WBC: 7.4 10*3/uL (ref 4.0–10.5)
nRBC: 0 % (ref 0.0–0.2)

## 2021-12-21 LAB — I-STAT CHEM 8, ED
BUN: 24 mg/dL — ABNORMAL HIGH (ref 8–23)
Calcium, Ion: 1.02 mmol/L — ABNORMAL LOW (ref 1.15–1.40)
Chloride: 101 mmol/L (ref 98–111)
Creatinine, Ser: 1 mg/dL (ref 0.61–1.24)
Glucose, Bld: 126 mg/dL — ABNORMAL HIGH (ref 70–99)
HCT: 35 % — ABNORMAL LOW (ref 39.0–52.0)
Hemoglobin: 11.9 g/dL — ABNORMAL LOW (ref 13.0–17.0)
Potassium: 2.9 mmol/L — ABNORMAL LOW (ref 3.5–5.1)
Sodium: 135 mmol/L (ref 135–145)
TCO2: 21 mmol/L — ABNORMAL LOW (ref 22–32)

## 2021-12-21 LAB — MAGNESIUM: Magnesium: 2.1 mg/dL (ref 1.7–2.4)

## 2021-12-21 LAB — CREATININE, URINE, RANDOM: Creatinine, Urine: 153.86 mg/dL

## 2021-12-21 LAB — PROCALCITONIN: Procalcitonin: 2.09 ng/mL

## 2021-12-21 LAB — CK: Total CK: 1831 U/L — ABNORMAL HIGH (ref 49–397)

## 2021-12-21 LAB — SODIUM, URINE, RANDOM: Sodium, Ur: 20 mmol/L

## 2021-12-21 LAB — CBG MONITORING, ED: Glucose-Capillary: 155 mg/dL — ABNORMAL HIGH (ref 70–99)

## 2021-12-21 LAB — STREP PNEUMONIAE URINARY ANTIGEN: Strep Pneumo Urinary Antigen: NEGATIVE

## 2021-12-21 LAB — LACTIC ACID, PLASMA
Lactic Acid, Venous: 1.4 mmol/L (ref 0.5–1.9)
Lactic Acid, Venous: 1.1 mmol/L (ref 0.5–1.9)

## 2021-12-21 LAB — RESP PANEL BY RT-PCR (FLU A&B, COVID) ARPGX2
Influenza A by PCR: NEGATIVE
Influenza B by PCR: NEGATIVE
SARS Coronavirus 2 by RT PCR: NEGATIVE

## 2021-12-21 MED ORDER — ENOXAPARIN SODIUM 40 MG/0.4ML IJ SOSY
40.0000 mg | PREFILLED_SYRINGE | INTRAMUSCULAR | Status: DC
  Administered 2021-12-21 – 2021-12-29 (×9): 40 mg via SUBCUTANEOUS
  Filled 2021-12-21 (×9): qty 0.4

## 2021-12-21 MED ORDER — LACTATED RINGERS IV BOLUS
1000.0000 mL | Freq: Once | INTRAVENOUS | Status: AC
  Administered 2021-12-21: 1000 mL via INTRAVENOUS

## 2021-12-21 MED ORDER — ACETAMINOPHEN 650 MG RE SUPP: 650.0000 mg | Freq: Four times a day (QID) | RECTAL | Status: DC | PRN

## 2021-12-21 MED ORDER — SENNOSIDES-DOCUSATE SODIUM 8.6-50 MG PO TABS: 1.0000 | ORAL_TABLET | Freq: Every evening | ORAL | Status: DC | PRN

## 2021-12-21 MED ORDER — SODIUM CHLORIDE 0.9 % IV SOLN
500.0000 mg | Freq: Once | INTRAVENOUS | Status: AC
  Administered 2021-12-21: 500 mg via INTRAVENOUS
  Filled 2021-12-21: qty 5

## 2021-12-21 MED ORDER — LACTATED RINGERS IV SOLN: INTRAVENOUS | Status: AC

## 2021-12-21 MED ORDER — SODIUM CHLORIDE 0.9 % IV SOLN
2.0000 g | INTRAVENOUS | Status: DC
  Administered 2021-12-22 – 2021-12-24 (×3): 2 g via INTRAVENOUS
  Filled 2021-12-21 (×3): qty 20

## 2021-12-21 MED ORDER — ACETAMINOPHEN 500 MG PO TABS
1000.0000 mg | ORAL_TABLET | Freq: Once | ORAL | Status: AC
  Administered 2021-12-21: 1000 mg via ORAL
  Filled 2021-12-21: qty 2

## 2021-12-21 MED ORDER — POTASSIUM CHLORIDE 10 MEQ/100ML IV SOLN
10.0000 meq | INTRAVENOUS | Status: AC
  Administered 2021-12-21 – 2021-12-22 (×4): 10 meq via INTRAVENOUS
  Filled 2021-12-21 (×5): qty 100

## 2021-12-21 MED ORDER — PANTOPRAZOLE SODIUM 40 MG IV SOLR
40.0000 mg | INTRAVENOUS | Status: DC
  Administered 2021-12-21 – 2021-12-22 (×2): 40 mg via INTRAVENOUS
  Filled 2021-12-21 (×2): qty 10

## 2021-12-21 MED ORDER — INSULIN ASPART 100 UNIT/ML IJ SOLN
0.0000 [IU] | INTRAMUSCULAR | Status: DC
  Administered 2021-12-21 – 2021-12-23 (×4): 1 [IU] via SUBCUTANEOUS
  Administered 2021-12-26: 3 [IU] via SUBCUTANEOUS

## 2021-12-21 MED ORDER — SODIUM CHLORIDE 0.9 % IV SOLN
500.0000 mg | INTRAVENOUS | Status: DC
  Administered 2021-12-22: 500 mg via INTRAVENOUS
  Filled 2021-12-21: qty 5

## 2021-12-21 MED ORDER — SODIUM CHLORIDE 0.9% FLUSH
3.0000 mL | Freq: Two times a day (BID) | INTRAVENOUS | Status: DC
  Administered 2021-12-21 – 2021-12-30 (×16): 3 mL via INTRAVENOUS

## 2021-12-21 MED ORDER — IOHEXOL 300 MG/ML  SOLN
100.0000 mL | Freq: Once | INTRAMUSCULAR | Status: AC | PRN
  Administered 2021-12-21: 100 mL via INTRAVENOUS

## 2021-12-21 MED ORDER — ACETAMINOPHEN 325 MG PO TABS
650.0000 mg | ORAL_TABLET | Freq: Four times a day (QID) | ORAL | Status: DC | PRN
  Administered 2021-12-22 – 2021-12-27 (×3): 650 mg via ORAL
  Filled 2021-12-21 (×3): qty 2

## 2021-12-21 MED ORDER — SODIUM CHLORIDE 0.9 % IV SOLN
2.0000 g | Freq: Once | INTRAVENOUS | Status: AC
  Administered 2021-12-21: 2 g via INTRAVENOUS
  Filled 2021-12-21: qty 20

## 2021-12-21 NOTE — ED Notes (Signed)
1st lactic just received per lab

## 2021-12-21 NOTE — H&P (Signed)
History and Physical    GABRIELA GIANNELLI PTW:[id number] DOB: 06-24-1945 DOA: 12/21/2021  PCP: [facility]   Patient coming from: Home   Chief Complaint: Found down   HPI: Benjamin Boyle is a pleasant 76 y.o. male with medical history significant for dementia, type 2 diabetes mellitus, hypertension, and odontoid fracture, now presenting to the emergency department after he was found down.  History is very limited due to the patient's clinical condition and inability to reach family.  EMS reported that patient was found under his bed and they thought he may have been down for 24 hours.  He was given magnesium and calcium prior to arrival in the hospital due to something that this on the cardiac monitor, again details very limited unfortunately.  ED Course: Upon arrival to the ED, patient is found to be febrile to 38.6 C and saturating upper 90s on 2 L/min of supplemental oxygen with mild tachycardia and tachypnea, and stable blood pressure.  EKG features sinus rhythm with PVCs.  No acute findings on CT head or cervical spine.  Chest CT notable for dense airspace consolidation in the right middle lobe and patchy opacity in the right lower lobe most suggestive of multifocal pneumonia.  CT chest also notable for wall thickening of the duodenum with adjacent fluids.  Review of Systems:  All other systems reviewed and apart from HPI, are negative.  Past Medical History:  Diagnosis Date   Acid reflux    Anemia    Bilateral pseudophakia    CAD (coronary artery disease)    Cataracts, bilateral    Chronic ischemic heart disease    Dementia (HCC)    Depression    Depression    Diabetes ([place])    Fuchs' corneal dystrophy of both eyes    Glaucoma    Hyperlipidemia    Hypertension    Migraines    Paroxysmal VT ([place])    Peripheral nerve disease    PTSD (post-traumatic stress disorder)    Sleep apnea    Spinal stenosis     Past Surgical History:  Procedure Laterality Date   BACK  SURGERY     x 2   HERNIA REPAIR N/A    SHOULDER ARTHROSCOPY Right     Social History:   reports that he has been smoking cigarettes. He has a 30.00 pack-year smoking history. He has never used smokeless tobacco. He reports that he does not drink alcohol and does not use drugs.  Allergies  Allergen Reactions   Codeine Nausea And Vomiting   Donepezil Diarrhea   Gabapentin     Other reaction(s): Memory impairment   Glipizide     Other reaction(s): Hypoglycemia   Zomig [Zolmitriptan]     Family History  Problem Relation Age of Onset   Heart disease Mother    Diabetes Father      Prior to Admission medications   Medication Sig Start Date End Date Taking? Authorizing Provider  acetaminophen (TYLENOL) 500 MG tablet Take by mouth.    [provider]  Alogliptin Benzoate 25 MG TABS TAKE ONE-HALF TABLET BY MOUTH DAILY FOR DIABETES 05/13/21   [provider]  amLODipine (NORVASC) 10 MG tablet TAKE ONE-HALF TABLET BY MOUTH DAILY FOR HEART IN PLACE OF METOPROLOL 07/07/21   [provider]  atorvastatin (LIPITOR) 80 MG tablet TAKE ONE-HALF TABLET BY MOUTH AT BEDTIME FOR CHOLESTEROL 09/08/20   [provider]  cetirizine (ZYRTEC) 10 MG tablet TAKE ONE TABLET BY MOUTH EVERY MORNING  FOR CONGESTION- TAKE FOR 2  WEEKS THEN STOP IF CLEARED 01/05/21   [provider]  clonazePAM (KLONOPIN) 0.5 MG tablet TAKE TWO AND ONE-HALF TABLETS BY MOUTH AT BEDTIME - NEW, FOR JUMPING LEGS (MYOCLONUS) 07/07/21   [provider]  COVID-19 mRNA bivalent vaccine, Pfizer, injection Inject into the muscle. 03/30/21   Carlyle Basques, MD  cyanocobalamin 100 MCG tablet take 1 tablet by oral route every day    [provider]  HYDROcodone-acetaminophen (NORCO/VICODIN) 5-325 MG tablet Take 1 tablet by mouth every 6 (six) hours as needed. 09/30/21   [provider]  losartan (COZAAR) 100 MG tablet TAKE ONE TABLET BY MOUTH EVERY MORNING FOR BLOOD PRESSURE;  TAKE IN PLACE OF LISINOPRIL;  STOP THE LISINOPRIL 02/06/21   [provider]  memantine (NAMENDA) 10 MG tablet take 2 tablets by oral route every day at bedtime to slow memory loss    [provider]  Menthol-Methyl Salicylate (THERA-GESIC) 0.5-15 % CREA APPLY SMALL AMOUNT TO AFFECTED AREA EVERY FOUR HOURS FOR NECK PAIN 08/26/21   [provider]  metFORMIN (GLUCOPHAGE-XR) 500 MG 24 hr tablet Take by mouth.    [provider]  methocarbamol (ROBAXIN) 750 MG tablet TAKE ONE TABLET BY MOUTH AT BEDTIME NECK PAIN 08/26/21   [provider]  moxifloxacin (VIGAMOX) 0.5 % ophthalmic solution INSTILL 1 DROP IN LEFT EYE FOUR TIMES A DAY FOR EYE PROCEDURE 08/31/21   [provider]  naproxen (NAPROSYN) 500 MG tablet TAKE ONE TABLET BY MOUTH TWICE A DAY AS NEEDED (TAKE WITH FOOD) FOR PAIN 03/09/21   [provider]  neomycin-polymyxin-dexameth (MAXITROL) 0.1 % OINT APPLY THIN RIBBON TO LEFT EYE AT BEDTIME FOR EYE PROCEDURE 08/31/21   [provider]  ondansetron (ZOFRAN-ODT) 4 MG disintegrating tablet Take 4 mg by mouth every 8 (eight) hours as needed. 09/30/21   [provider]  PARoxetine (PAXIL) 40 MG tablet TAKE ONE AND ONE-HALF TABLETS BY MOUTH DAILY FOR MENTAL HEALTH 02/06/21   [provider]  pramipexole (MIRAPEX) 1 MG tablet TAKE ONE TABLET BY MOUTH AT BEDTIME FOR RESTLESS LEG SYNDROME, NOT A DOSE CHANGE 10/23/20   [provider]  prednisoLONE acetate (PRED FORTE) 1 % ophthalmic suspension INSTILL 1 DROP IN LEFT EYE FOUR TIMES A DAY PLEASE DISPENSE 10 ML BOTTLE, IDEALLY PINK TOP IF AVAILABLE. 08/31/21   [provider]  pregabalin (LYRICA) 100 MG capsule TAKE ONE CAPSULE BY MOUTH TWICE A DAY - INCREASED FROM 75 MG TWICE DAILY 07/07/21   [provider]  rivastigmine (EXELON) 6 MG capsule TAKE ONE CAPSULE BY MOUTH TWICE A DAY WITH MEALS TO SLOW MEMORY LOSS. TAKE IN PLACE OF DONEPEZIL. NO CHANGE 07/07/21    [provider]  sildenafil (VIAGRA) 100 MG tablet TAKE ONE-HALF TABLET BY MOUTH AS DIRECTED (TAKE 1 HOUR PRIOR TO SEXUAL ACTIVITY *DO NOT EXCEED 1 DOSE PER 24 HOUR PERIOD*) DO NOT TAKE SILDENAFIL ON DAYS THAT YOU TAKE TAMSULOSIN. 02/06/21   [provider]  sodium chloride (MURO 128) 5 % ophthalmic ointment APPLY THIN RIBBON TO EACH EYE TWICE A DAY FOR CORNEAL EDEMA 07/02/21   [provider]  tamsulosin (FLOMAX) 0.4 MG CAPS capsule TAKE ONE CAPSULE BY MOUTH ONCE A DAY 02/06/21   [provider]  traZODone (DESYREL) 50 MG tablet Take 1 tablet by mouth at bedtime. 10/07/20   [provider]  vitamin B-12 (CYANOCOBALAMIN) 500 MCG tablet Take 1 tablet by mouth daily. 05/13/21   [provider]    Physical Exam: Vitals:   12/21/21 1845 12/21/21 1915 12/21/21 1945 12/21/21 2011  BP: 126/78 120/75 118/85   Pulse: 84 74 88   Resp: 16 19 (!) 23   Temp:    97.7 F (36.5 C)  TempSrc:    Oral  SpO2: 100% 93% 98%     Constitutional: NAD, no pallor or diaphoresis   Eyes: PERTLA, lids and conjunctivae normal ENMT: Mucous membranes are moist. Posterior pharynx clear of any exudate or lesions.   Neck: supple, no masses  Respiratory: no wheezing. No accessory muscle use.  Cardiovascular: S1 & S2 heard, regular rate and rhythm. No extremity edema.  Abdomen: No distension, no tenderness, soft. Bowel sounds active.  Musculoskeletal: no clubbing / cyanosis. No joint deformity upper and lower extremities.   Skin: no significant rashes, lesions, ulcers. Warm, dry, well-perfused. Neurologic: CN 2-12 grossly intact. Moving all extremities. Sleeping, wakes to loud voice but not answering questions.    Labs and Imaging on Admission: I have personally reviewed following labs and imaging studies  CBC: Recent Labs  Lab 12/21/21 1600 12/21/21 1607  WBC 7.4  --   NEUTROABS 6.4  --   HGB 12.7* 11.9*  HCT 38.2* 35.0*  MCV 91.2  --   PLT 173  --    Basic  Metabolic Panel: Recent Labs  Lab 12/21/21 1600 12/21/21 1607  NA 135 135  K 2.9* 2.9*  CL 101 101  CO2 22  --   GLUCOSE 130* 126*  BUN 23 24*  CREATININE 1.14 1.00  CALCIUM 8.7*  --    GFR: CrCl cannot be calculated (Unknown ideal weight.). Liver Function Tests: Recent Labs  Lab 12/21/21 1600  AST 149*  ALT 95*  ALKPHOS 50  BILITOT 0.9  PROT 5.6*  ALBUMIN 2.5*   No results for input(s): "LIPASE", "AMYLASE" in the last 168 hours. No results for input(s): "AMMONIA" in the last 168 hours. Coagulation Profile: No results for input(s): "INR", "PROTIME" in the last 168 hours. Cardiac Enzymes: Recent Labs  Lab 12/21/21 1600  CKTOTAL 1,831*   BNP (last 3 results) No results for input(s): "PROBNP" in the last 8760 hours. HbA1C: No results for input(s): "HGBA1C" in the last 72 hours. CBG: No results for input(s): "GLUCAP" in the last 168 hours. Lipid Profile: No results for input(s): "CHOL", "HDL", "LDLCALC", "TRIG", "CHOLHDL", "LDLDIRECT" in the last 72 hours. Thyroid Function Tests: No results for input(s): "TSH", "T4TOTAL", "FREET4", "T3FREE", "THYROIDAB" in the last 72 hours. Anemia Panel: No results for input(s): "VITAMINB12", "FOLATE", "FERRITIN", "TIBC", "IRON", "RETICCTPCT" in the last 72 hours. Urine analysis: No results found for: "COLORURINE", "APPEARANCEUR", "LABSPEC", "PHURINE", "GLUCOSEU", "HGBUR", "BILIRUBINUR", "KETONESUR", "PROTEINUR", "UROBILINOGEN", "NITRITE", "LEUKOCYTESUR" Sepsis Labs: $[id number]$ (procalcitonin:4,lacticidven:4) ) Recent Results (from the past 240 hour(s))  Resp Panel by RT-PCR (Flu A&B, Covid) Anterior Nasal Swab     Status: None   Collection Time: 12/21/21  6:01 PM   Specimen: Anterior Nasal Swab  Result Value Ref Range Status   SARS Coronavirus 2 by RT PCR NEGATIVE NEGATIVE Final    Comment: (NOTE) SARS-CoV-2 target nucleic acids are NOT DETECTED.  The SARS-CoV-2 RNA is generally detectable in upper respiratory specimens  during the acute phase of infection. The lowest concentration of SARS-CoV-2 viral copies this assay can detect is 138 copies/mL. A negative result does not preclude SARS-Cov-2 infection and should not be used as the sole basis for treatment or other patient management decisions. A negative result may occur with  improper  specimen collection/handling, submission of specimen other than nasopharyngeal swab, presence of viral mutation(s) within the areas targeted by this assay, and inadequate number of viral copies(<138 copies/mL). A negative result must be combined with clinical observations, patient history, and epidemiological information. The expected result is Negative.  Fact Sheet for Patients:  EntrepreneurPulse.com.au  Fact Sheet for Healthcare Providers:  IncredibleEmployment.be  This test is no t yet approved or cleared by the [place] FDA and  has been authorized for detection and/or diagnosis of SARS-CoV-2 by FDA under an Emergency Use Authorization (EUA). This EUA will remain  in effect (meaning this test can be used) for the duration of the COVID-19 declaration under Section 564(b)(1) of the Act, 21 U.S.C.section 360bbb-3(b)(1), unless the authorization is terminated  or revoked sooner.       Influenza A by PCR NEGATIVE NEGATIVE Final   Influenza B by PCR NEGATIVE NEGATIVE Final    Comment: (NOTE) The Xpert Xpress SARS-CoV-2/FLU/RSV plus assay is intended as an aid in the diagnosis of influenza from Nasopharyngeal swab specimens and should not be used as a sole basis for treatment. Nasal washings and aspirates are unacceptable for Xpert Xpress SARS-CoV-2/FLU/RSV testing.  Fact Sheet for Patients: EntrepreneurPulse.com.au  Fact Sheet for Healthcare Providers: IncredibleEmployment.be  This test is not yet approved or cleared by the [place] FDA and has been authorized for detection  and/or diagnosis of SARS-CoV-2 by FDA under an Emergency Use Authorization (EUA). This EUA will remain in effect (meaning this test can be used) for the duration of the COVID-19 declaration under Section 564(b)(1) of the Act, 21 U.S.C. section 360bbb-3(b)(1), unless the authorization is terminated or revoked.  Performed at [facility], [place] [address], [place], [place] [address]      Radiological Exams on Admission: CT L-SPINE NO CHARGE  Result Date: 12/21/2021 CLINICAL DATA:  [place].  Back pain. EXAM: CT LUMBAR SPINE WITHOUT CONTRAST TECHNIQUE: Multidetector CT imaging of the lumbar spine was performed without intravenous contrast administration. Multiplanar CT image reconstructions were also generated. RADIATION DOSE REDUCTION: This exam was performed according to the departmental dose-optimization program which includes automated exposure control, adjustment of the mA and/or kV according to patient size and/or use of iterative reconstruction technique. COMPARISON:  02/16/2011 FINDINGS: Segmentation: 5 lumbar type vertebral bodies. Alignment: No malalignment. Vertebrae: No fracture in the region from inferior T11 through S4. Distant decompression and fusion at the L3-4 level with pedicle screws and posterior rods. Paraspinal and other soft tissues: Negative Disc levels: No significant disc level pathology at T11-12 or T12-L1. L1-2: Circumferential disc bulge. Mild stenosis of both lateral recesses. L2-3: Circumferential disc bulge. Facet and ligamentous hypertrophy. Multifactorial spinal stenosis that could be significant. L3-4: Previous fusion procedure. Sufficient patency of the canal and foramina. L4-5: Previous posterior decompression. Circumferential disc protrusion. Potential for significant stenosis at this level. L5-S1: Circumferential disc protrusion more prominent towards the right. Facet arthropathy worse on the right. Stenosis of the lateral recesses and foramina right more than left  that could be significant. IMPRESSION: No acute or traumatic finding in the region from inferior T11 through S4. Distant fusion procedure at L3-4 has a satisfactory appearance. Severe multifactorial stenosis at L2-3 and L4-5 that could be significant. Lateral recess and foraminal stenosis right worse than left at L5-S1 that could be significant. Electronically Signed   By: Nelson Chimes M.D.   On: 12/21/2021 18:01   CT CHEST ABDOMEN PELVIS W CONTRAST  Result Date: 12/21/2021 CLINICAL DATA:  Fall.  Head  trauma.  Poly trauma.  Found down. EXAM: CT CHEST, ABDOMEN, AND PELVIS WITH CONTRAST TECHNIQUE: Multidetector CT imaging of the chest, abdomen and pelvis was performed following the standard protocol during bolus administration of intravenous contrast. RADIATION DOSE REDUCTION: This exam was performed according to the departmental dose-optimization program which includes automated exposure control, adjustment of the mA and/or kV according to patient size and/or use of iterative reconstruction technique. CONTRAST:  14mL OMNIPAQUE IOHEXOL 300 MG/ML  SOLN COMPARISON:  None Available. FINDINGS: CT CHEST FINDINGS Cardiovascular: The heart size is normal. No substantial pericardial effusion. Coronary artery calcification is evident. Moderate atherosclerotic calcification is noted in the wall of the thoracic aorta. Mediastinum/Nodes: 11 mm short axis precarinal lymph node is borderline enlarged. Adjacent upper normal subcarinal lymph nodes measure up to 10 mm short axis. There is no hilar lymphadenopathy. the esophagus has normal imaging features. There is no axillary lymphadenopathy. Lungs/Pleura: Centrilobular and paraseptal emphysema evident. Dense airspace consolidation identified in the right middle lobe with patchy and nodular airspace disease identified in the right lower lobe including 3.2 cm nodular component on image 101/6. No suspicious pulmonary nodule or mass in the left lung. Tiny right pleural effusion  evident. Musculoskeletal: No worrisome lytic or sclerotic osseous abnormality. 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 CT ABDOMEN PELVIS FINDINGS Hepatobiliary: No suspicious focal abnormality within the liver parenchyma. Gallbladder is nondistended with potential trace pericholecystic fluid. No intrahepatic or extrahepatic biliary dilation. Pancreas: No focal mass lesion. No dilatation of the main duct. No intraparenchymal cyst. No peripancreatic edema. Spleen: No splenomegaly. No focal mass lesion. Adrenals/Urinary Tract: Thickening of the adrenal glands bilaterally is compatible with hyperplasia. 2.7 cm exophytic cyst noted upper pole left kidney no suspicious enhancing mass lesion identified in either kidney. No evidence for hydroureter. Bladder is distended. Stomach/Bowel: Stomach is unremarkable. No gastric wall thickening. No evidence of outlet obstruction. Duodenal wall appears thickened along the descending and proximal transverse segment with edema/fluid adjacent. No small bowel wall thickening. No small bowel dilatation. The terminal ileum is normal. The appendix is normal. No gross colonic mass. No colonic wall thickening. Vascular/Lymphatic: There is moderate atherosclerotic calcification of the abdominal aorta without aneurysm. There is no gastrohepatic or hepatoduodenal ligament lymphadenopathy. No retroperitoneal or mesenteric lymphadenopathy. No pelvic sidewall lymphadenopathy. Reproductive: The prostate gland and seminal vesicles are unremarkable. Other: Trace free fluid is seen along the inferior liver. Edema seen in the retroperitoneal tissues bilaterally and in the presacral space. Musculoskeletal: No worrisome lytic or sclerotic osseous abnormality. Lumbar fusion hardware evident. IMPRESSION: 1. Dense airspace consolidation in the right middle lobe with patchy and nodular airspace disease in the right lower lobe. Imaging features are most suggestive of  multifocal pneumonia. Given the nodular character of the airspace disease in the right lower lobe, close follow-up recommended to ensure resolution. 2. Borderline mediastinal lymphadenopathy, likely reactive. 3. Wall thickening along the descending and proximal transverse segment of the duodenum with edema/fluid adjacent to the duodenum. Imaging features may be related to an infectious/inflammatory duodenitis. Peptic ulcer disease not excluded. 4. Trace free fluid along the inferior liver. 5. Edema in the retroperitoneal tissues bilaterally and in the presacral space. This is nonspecific and may be related to systemic disease. 6. Distended urinary bladder. 7. 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 8. Aortic Atherosclerosis (ICD10-I70.0) and Emphysema (ICD10-J43.9). Electronically Signed   By: Misty Stanley M.D.   On: 12/21/2021 17:43   CT Head Wo Contrast  Result Date: 12/21/2021 CLINICAL DATA:  Neck trauma.  Fall. EXAM: CT HEAD WITHOUT CONTRAST CT CERVICAL SPINE WITHOUT CONTRAST TECHNIQUE: Multidetector CT imaging of the head and cervical spine was performed following the standard protocol without intravenous contrast. Multiplanar CT image reconstructions of the cervical spine were also generated. RADIATION DOSE REDUCTION: This exam was performed according to the departmental dose-optimization program which includes automated exposure control, adjustment of the mA and/or kV according to patient size and/or use of iterative reconstruction technique. COMPARISON:  CT examination dated September 30, 2021 FINDINGS: CT HEAD FINDINGS Brain: No evidence of acute infarction, hemorrhage, hydrocephalus, extra-axial collection or mass lesion/mass effect. Mega cisterna magna. Moderate cerebral volume loss and chronic microvascular ischemic changes of the white matter. Left occipital encephalomalacia suggesting prior infarct, unchanged. Vascular: No hyperdense vessel or unexpected calcification. Skull: Normal.  Negative for fracture or focal lesion. Sinuses/Orbits: Patchy opacification of the ethmoid air cells. Other: None. CT CERVICAL SPINE FINDINGS Alignment: Unchanged anterolisthesis of dense secondary to the fracture. Reversal of normal cervical lordosis. Mild retrolisthesis of C5. Skull base and vertebrae: Unchanged appearance of the type 3 dens fracture with asymmetric extension into the lateral mass of C2 with mild anterior subluxation. Soft tissues and spinal canal: No prevertebral fluid or swelling. No visible canal hematoma. Disc levels: Advanced degenerative disc disease most prominent at C4-C5, C5-C6, unchanged. Moderate spinal canal and neural foraminal stenosis bilaterally at these levels. Upper chest: Emphysematous changes with pleural/parenchymal scarring. Other: None IMPRESSION: CT head: 1.  No acute intracranial abnormality. 2. 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 CT cervical spine: 1. Unchanged appearance of the type 3 odontoid process fracture. No new fracture. 2. 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 No significant soft tissue abnormality or significant interval change. Electronically Signed   By: Keane Police D.O.   On: 12/21/2021 17:32   CT Cervical Spine Wo Contrast  Result Date: 12/21/2021 CLINICAL DATA:  Neck trauma.  Fall. EXAM: CT HEAD WITHOUT CONTRAST CT CERVICAL SPINE WITHOUT CONTRAST TECHNIQUE: Multidetector CT imaging of the head and cervical spine was performed following the standard protocol without intravenous contrast. Multiplanar CT image reconstructions of the cervical spine were also generated. RADIATION DOSE REDUCTION: This exam was performed according to the departmental dose-optimization program which includes automated exposure control, adjustment of the mA and/or kV according to patient size and/or use of iterative reconstruction  technique. COMPARISON:  CT examination dated September 30, 2021 FINDINGS: CT HEAD FINDINGS Brain: No evidence of acute infarction, hemorrhage, hydrocephalus, extra-axial collection or mass lesion/mass effect. Mega cisterna magna. Moderate cerebral volume loss and chronic microvascular ischemic changes of the white matter. Left occipital encephalomalacia suggesting prior infarct, unchanged. Vascular: No hyperdense vessel or unexpected calcification. Skull: Normal. Negative for fracture or focal lesion. Sinuses/Orbits: Patchy opacification of the ethmoid air cells. Other: None. CT CERVICAL SPINE FINDINGS Alignment: Unchanged anterolisthesis of dense secondary to the fracture. Reversal of normal cervical lordosis. Mild retrolisthesis of C5. Skull base and vertebrae: Unchanged appearance of the type 3 dens fracture with asymmetric extension into the lateral mass of C2 with mild anterior subluxation. Soft tissues and spinal canal: No prevertebral fluid or swelling. No visible canal hematoma. Disc levels: Advanced degenerative disc disease most prominent at C4-C5, C5-C6, unchanged. Moderate spinal canal and neural foraminal stenosis bilaterally at these levels. Upper chest: Emphysematous changes with pleural/parenchymal scarring. Other: None IMPRESSION: CT head: 1.  No acute intracranial abnormality. 2. Stable appearance  of the chronic left occipital infarct, cerebral atrophy and advanced microvascular ischemic changes of the white matter. CT cervical spine: 1. Unchanged appearance of the type 3 odontoid process fracture. No new fracture. 2. 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 No significant soft tissue abnormality or significant interval change. Electronically Signed   By: Keane Police D.O.   On: 12/21/2021 17:32    EKG: Independently reviewed. Sinus rhythm, PVCs.   Assessment/Plan   1. Sepsis d/t pneumonia; acute hypoxic respiratory failure  - Presents after being  found down and is febrile with mild tachycardia and new 2 Lpm supplemental O2 requirement  - Multifocal pneumonia on CT  - Blood cultures collected in ED and he was started on Rocephin and azithromycin  - Check strep pneumo and legionella antigens, continue Rocephin and azithromycin, trend procalcitonin, and continue supplemental O2 as needed    2. Fall  -  Presents after being found on the floor, circumstances unclear, unable to reach family, patient not providing hx  - No acute injuries identified on trauma scans  - Treat acute infection, use fall precautions   3. Hypokalemia  - Potassium 2.9 on admission  - Replacing, check magnesium    4. Type II DM  - A1c was 6.9% in February 2023  - Check CBGs and use low-intensity SSI for now    5. Odontoid process fracture  - Suffered type 3 odontoid process fracture in April, followed by neurosurgery  - Appears stable on CT in ED  - Continue collar   6. Dementia  - Delirium precautions   7. Elevated LFTs - AST is 149 and ALT 95 on admission with normal alk phos and bilirubin  - No acute hepatobiliary findings on CT in ED  - Possibly from rhabdomyolysis, continue IVF and trend    8. CKD II - SCr is 1.14 on admission, up from 0.98 (GFR 81) in April in setting of dehydration and possibly mild rhabdomyolysis  - Gentle IVF hydration, monitor    9. Duodenitis - Duodenal wall-thickening and surrounding edema noted on CT in ED  - Abdominal exam benign  - Start PPI, continue antibiotics    DVT prophylaxis: Lovenox  Code Status: Full for now, unable to discuss with patient and family not available  Level of Care: Level of care: Telemetry Medical Family Communication: Daughter not answering phone and voicemail box full  Disposition Plan:  Patient is from: home  Anticipated d/c is to: TBD Anticipated d/c date is: 12/24/21  Patient currently: pending improved respiratory status  Consults called: none  Admission status: Inpatient      Vianne Bulls, MD [facility]  12/21/2021, 8:52 PM

## 2021-12-21 NOTE — ED Notes (Signed)
Pt on 2 L of O2 

## 2021-12-21 NOTE — ED Notes (Signed)
daughter Benjamin Boyle [phone] would like an update asap

## 2021-12-21 NOTE — ED Notes (Signed)
RN unsuccessful x2 with blood cultures

## 2021-12-21 NOTE — ED Triage Notes (Signed)
Pt BIB [facility] due to a fall. Pt was found under bed that happened more than 24 hours ago. Pt had fell recently and fx back. Pt has dementia at baseline. EMS states they gave mag and calcium for t waves.

## 2021-12-21 NOTE — ED Provider Notes (Signed)
[facility]   CSN: [id number] Arrival date & time: 12/21/21  1533     History  Chief Complaint  Patient presents with   Benjamin Boyle    Benjamin Boyle is a 76 y.o. male.  76 yo M with a cc of a fall. The patient is demented and says difficult to obtain a history.  Per EMS it was estimated that he had fallen about 24 hours ago.  He was found underneath his bed.  Had a prolonged extrication.  Has a known C-spine fracture and was in a collar on scene.  They changed out for hard collar for transport.  The patient denies any pain initially.  Later during exam he said his back may be hurt a little bit.  He is not sure exactly what happened.  Thinks maybe he slipped on the floor.   Fall       Home Medications Prior to Admission medications   Medication Sig Start Date End Date Taking? Authorizing Provider  acetaminophen (TYLENOL) 500 MG tablet Take by mouth.    [provider]  Alogliptin Benzoate 25 MG TABS TAKE ONE-HALF TABLET BY MOUTH DAILY FOR DIABETES 05/13/21   [provider]  amLODipine (NORVASC) 10 MG tablet TAKE ONE-HALF TABLET BY MOUTH DAILY FOR HEART IN PLACE OF METOPROLOL 07/07/21   [provider]  atorvastatin (LIPITOR) 80 MG tablet TAKE ONE-HALF TABLET BY MOUTH AT BEDTIME FOR CHOLESTEROL 09/08/20   [provider]  cetirizine (ZYRTEC) 10 MG tablet TAKE ONE TABLET BY MOUTH EVERY MORNING FOR CONGESTION- TAKE FOR 2  WEEKS THEN STOP IF CLEARED 01/05/21   [provider]  clonazePAM (KLONOPIN) 0.5 MG tablet TAKE TWO AND ONE-HALF TABLETS BY MOUTH AT BEDTIME - NEW, FOR JUMPING LEGS (MYOCLONUS) 07/07/21   [provider]  COVID-19 mRNA bivalent vaccine, Pfizer, injection Inject into the muscle. 03/30/21   Judyann MunsonSnider, Cynthia, MD  cyanocobalamin 100 MCG tablet take 1 tablet by oral route every day    [provider]  HYDROcodone-acetaminophen (NORCO/VICODIN) 5-325 MG tablet Take 1  tablet by mouth every 6 (six) hours as needed. 09/30/21   [provider]  losartan (COZAAR) 100 MG tablet TAKE ONE TABLET BY MOUTH EVERY MORNING FOR BLOOD PRESSURE; TAKE IN PLACE OF LISINOPRIL;  STOP THE LISINOPRIL 02/06/21   [provider]  memantine (NAMENDA) 10 MG tablet take 2 tablets by oral route every day at bedtime to slow memory loss    [provider]  Menthol-Methyl Salicylate (THERA-GESIC) 0.5-15 % CREA APPLY SMALL AMOUNT TO AFFECTED AREA EVERY FOUR HOURS FOR NECK PAIN 08/26/21   [provider]  metFORMIN (GLUCOPHAGE-XR) 500 MG 24 hr tablet Take by mouth.    [provider]  methocarbamol (ROBAXIN) 750 MG tablet TAKE ONE TABLET BY MOUTH AT BEDTIME NECK PAIN 08/26/21   [provider]  moxifloxacin (VIGAMOX) 0.5 % ophthalmic solution INSTILL 1 DROP IN LEFT EYE FOUR TIMES A DAY FOR EYE PROCEDURE 08/31/21   [provider]  naproxen (NAPROSYN) 500 MG tablet TAKE ONE TABLET BY MOUTH TWICE A DAY AS NEEDED (TAKE WITH FOOD) FOR PAIN 03/09/21   [provider]  neomycin-polymyxin-dexameth (MAXITROL) 0.1 % OINT APPLY THIN RIBBON TO LEFT EYE AT BEDTIME FOR EYE PROCEDURE 08/31/21   [provider]  ondansetron (ZOFRAN-ODT) 4 MG disintegrating tablet Take 4 mg by mouth every 8 (eight) hours as needed. 09/30/21   [provider]  PARoxetine (PAXIL) 40 MG tablet  TAKE ONE AND ONE-HALF TABLETS BY MOUTH DAILY FOR MENTAL HEALTH 02/06/21   [provider]  pramipexole (MIRAPEX) 1 MG tablet TAKE ONE TABLET BY MOUTH AT BEDTIME FOR RESTLESS LEG SYNDROME, NOT A DOSE CHANGE 10/23/20   [provider]  prednisoLONE acetate (PRED FORTE) 1 % ophthalmic suspension INSTILL 1 DROP IN LEFT EYE FOUR TIMES A DAY PLEASE DISPENSE 10 ML BOTTLE, IDEALLY PINK TOP IF AVAILABLE. 08/31/21   [provider]  pregabalin (LYRICA) 100 MG capsule TAKE ONE CAPSULE BY MOUTH TWICE A DAY - INCREASED FROM 75 MG TWICE DAILY 07/07/21    [provider]  rivastigmine (EXELON) 6 MG capsule TAKE ONE CAPSULE BY MOUTH TWICE A DAY WITH MEALS TO SLOW MEMORY LOSS. TAKE IN PLACE OF DONEPEZIL. NO CHANGE 07/07/21   [provider]  sildenafil (VIAGRA) 100 MG tablet TAKE ONE-HALF TABLET BY MOUTH AS DIRECTED (TAKE 1 HOUR PRIOR TO SEXUAL ACTIVITY *DO NOT EXCEED 1 DOSE PER 24 HOUR PERIOD*) DO NOT TAKE SILDENAFIL ON DAYS THAT YOU TAKE TAMSULOSIN. 02/06/21   [provider]  sodium chloride (MURO 128) 5 % ophthalmic ointment APPLY THIN RIBBON TO EACH EYE TWICE A DAY FOR CORNEAL EDEMA 07/02/21   [provider]  tamsulosin (FLOMAX) 0.4 MG CAPS capsule TAKE ONE CAPSULE BY MOUTH ONCE A DAY 02/06/21   [provider]  traZODone (DESYREL) 50 MG tablet Take 1 tablet by mouth at bedtime. 10/07/20   [provider]  vitamin B-12 (CYANOCOBALAMIN) 500 MCG tablet Take 1 tablet by mouth daily. 05/13/21   [provider]      Allergies    Codeine, Donepezil, Gabapentin, Glipizide, and Zomig [zolmitriptan]    Review of Systems   Review of Systems  Physical Exam Updated Vital Signs BP 118/85   Pulse 88   Temp 97.7 F (36.5 C) (Oral)   Resp (!) 23   SpO2 98%  Physical Exam Vitals and nursing note reviewed.  Constitutional:      Appearance: He is well-developed.  HENT:     Head: Normocephalic and atraumatic.  Eyes:     Pupils: Pupils are equal, round, and reactive to light.  Neck:     Vascular: No JVD.  Cardiovascular:     Rate and Rhythm: Normal rate and regular rhythm.     Heart sounds: No murmur heard.    No friction rub. No gallop.  Pulmonary:     Effort: No respiratory distress.     Breath sounds: No wheezing.  Abdominal:     General: There is no distension.     Tenderness: There is no abdominal tenderness. There is guarding. There is no rebound.     Comments: Some involuntary guarding diffusely about the abdomen.  Chronic per patient.  Musculoskeletal:        General: Normal  range of motion.     Cervical back: Normal range of motion and neck supple.     Comments: No obvious fifth line tenderness step-offs or deformities.  Skin:    Coloration: Skin is not pale.     Findings: No rash.  Neurological:     Mental Status: He is alert and oriented to person, place, and time.  Psychiatric:        Behavior: Behavior normal.     ED Results / Procedures / Treatments   Labs (all labs ordered are listed, but only abnormal results are displayed) Labs Reviewed  CBC WITH DIFFERENTIAL/PLATELET - Abnormal; Notable for the following components:  Result Value   RBC 4.19 (*)    Hemoglobin 12.7 (*)    HCT 38.2 (*)    Lymphs Abs 0.4 (*)    All other components within normal limits  COMPREHENSIVE METABOLIC PANEL - Abnormal; Notable for the following components:   Potassium 2.9 (*)    Glucose, Bld 130 (*)    Calcium 8.7 (*)    Total Protein 5.6 (*)    Albumin 2.5 (*)    AST 149 (*)    ALT 95 (*)    All other components within normal limits  CK - Abnormal; Notable for the following components:   Total CK 1,831 (*)    All other components within normal limits  I-STAT CHEM 8, ED - Abnormal; Notable for the following components:   Potassium 2.9 (*)    BUN 24 (*)    Glucose, Bld 126 (*)    Calcium, Ion 1.02 (*)    TCO2 21 (*)    Hemoglobin 11.9 (*)    HCT 35.0 (*)    All other components within normal limits  RESP PANEL BY RT-PCR (FLU A&B, COVID) ARPGX2  CULTURE, BLOOD (ROUTINE X 2)  CULTURE, BLOOD (ROUTINE X 2)  LACTIC ACID, PLASMA  URINALYSIS, ROUTINE W REFLEX MICROSCOPIC  LACTIC ACID, PLASMA  PROCALCITONIN  PROCALCITONIN  MAGNESIUM    EKG EKG Interpretation  Date/Time:  Monday December 21 2021 15:49:45 EDT Ventricular Rate:  99 PR Interval:  148 QRS Duration: 107 QT Interval:  353 QTC Calculation: 453 R Axis:   64 Text Interpretation: Sinus rhythm Multiple premature complexes, vent & supraven Borderline low voltage, extremity leads No old tracing  to compare Confirmed by Melene Plan [phone]) on 12/21/2021 4:02:15 PM  Radiology CT L-SPINE NO CHARGE  Result Date: 12/21/2021 CLINICAL DATA:  Larey Seat.  Back pain. EXAM: CT LUMBAR SPINE WITHOUT CONTRAST TECHNIQUE: Multidetector CT imaging of the lumbar spine was performed without intravenous contrast administration. Multiplanar CT image reconstructions were also generated. RADIATION DOSE REDUCTION: This exam was performed according to the departmental dose-optimization program which includes automated exposure control, adjustment of the mA and/or kV according to patient size and/or use of iterative reconstruction technique. COMPARISON:  02/16/2011 FINDINGS: Segmentation: 5 lumbar type vertebral bodies. Alignment: No malalignment. Vertebrae: No fracture in the region from inferior T11 through S4. Distant decompression and fusion at the L3-4 level with pedicle screws and posterior rods. Paraspinal and other soft tissues: Negative Disc levels: No significant disc level pathology at T11-12 or T12-L1. L1-2: Circumferential disc bulge. Mild stenosis of both lateral recesses. L2-3: Circumferential disc bulge. Facet and ligamentous hypertrophy. Multifactorial spinal stenosis that could be significant. L3-4: Previous fusion procedure. Sufficient patency of the canal and foramina. L4-5: Previous posterior decompression. Circumferential disc protrusion. Potential for significant stenosis at this level. L5-S1: Circumferential disc protrusion more prominent towards the right. Facet arthropathy worse on the right. Stenosis of the lateral recesses and foramina right more than left that could be significant. IMPRESSION: No acute or traumatic finding in the region from inferior T11 through S4. Distant fusion procedure at L3-4 has a satisfactory appearance. Severe multifactorial stenosis at L2-3 and L4-5 that could be significant. Lateral recess and foraminal stenosis right worse than left at L5-S1 that could be significant.  Electronically Signed   By: Paulina Fusi M.D.   On: 12/21/2021 18:01   CT CHEST ABDOMEN PELVIS W CONTRAST  Result Date: 12/21/2021 CLINICAL DATA:  Fall.  Head trauma.  Poly trauma.  Found down. EXAM: CT CHEST,  ABDOMEN, AND PELVIS WITH CONTRAST TECHNIQUE: Multidetector CT imaging of the chest, abdomen and pelvis was performed following the standard protocol during bolus administration of intravenous contrast. RADIATION DOSE REDUCTION: This exam was performed according to the departmental dose-optimization program which includes automated exposure control, adjustment of the mA and/or kV according to patient size and/or use of iterative reconstruction technique. CONTRAST:  OMNIPAQUE IOHEXOL 300 MG/ML  SOLN COMPARISON:  None Available. FINDINGS: CT CHEST FINDINGS Cardiovascular: The heart size is normal. No substantial pericardial effusion. Coronary artery calcification is evident. Moderate atherosclerotic calcification is noted in the wall of the thoracic aorta. Mediastinum/Nodes: 11 mm short axis precarinal lymph node is borderline enlarged. Adjacent upper normal subcarinal lymph nodes measure up to 10 mm short axis. There is no hilar lymphadenopathy. the esophagus has normal imaging features. There is no axillary lymphadenopathy. Lungs/Pleura: Centrilobular and paraseptal emphysema evident. Dense airspace consolidation identified in the right middle lobe with patchy and nodular airspace disease identified in the right lower lobe including 3.2 cm nodular component on image 101/6. No suspicious pulmonary nodule or mass in the left lung. Tiny right pleural effusion evident. Musculoskeletal: No worrisome lytic or sclerotic osseous abnormality. 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 CT ABDOMEN PELVIS FINDINGS Hepatobiliary: No suspicious focal abnormality within the liver parenchyma. Gallbladder is nondistended with potential trace pericholecystic fluid. No intrahepatic or  extrahepatic biliary dilation. Pancreas: No focal mass lesion. No dilatation of the main duct. No intraparenchymal cyst. No peripancreatic edema. Spleen: No splenomegaly. No focal mass lesion. Adrenals/Urinary Tract: Thickening of the adrenal glands bilaterally is compatible with hyperplasia. 2.7 cm exophytic cyst noted upper pole left kidney no suspicious enhancing mass lesion identified in either kidney. No evidence for hydroureter. Bladder is distended. Stomach/Bowel: Stomach is unremarkable. No gastric wall thickening. No evidence of outlet obstruction. Duodenal wall appears thickened along the descending and proximal transverse segment with edema/fluid adjacent. No small bowel wall thickening. No small bowel dilatation. The terminal ileum is normal. The appendix is normal. No gross colonic mass. No colonic wall thickening. Vascular/Lymphatic: There is moderate atherosclerotic calcification of the abdominal aorta without aneurysm. There is no gastrohepatic or hepatoduodenal ligament lymphadenopathy. No retroperitoneal or mesenteric lymphadenopathy. No pelvic sidewall lymphadenopathy. Reproductive: The prostate gland and seminal vesicles are unremarkable. Other: Trace free fluid is seen along the inferior liver. Edema seen in the retroperitoneal tissues bilaterally and in the presacral space. Musculoskeletal: No worrisome lytic or sclerotic osseous abnormality. Lumbar fusion hardware evident. IMPRESSION: 1. Dense airspace consolidation in the right middle lobe with patchy and nodular airspace disease in the right lower lobe. Imaging features are most suggestive of multifocal pneumonia. Given the nodular character of the airspace disease in the right lower lobe, close follow-up recommended to ensure resolution. 2. Borderline mediastinal lymphadenopathy, likely reactive. 3. Wall thickening along the descending and proximal transverse segment of the duodenum with edema/fluid adjacent to the duodenum. Imaging  features may be related to an infectious/inflammatory duodenitis. Peptic ulcer disease not excluded. 4. Trace free fluid along the inferior liver. 5. Edema in the retroperitoneal tissues bilaterally and in the presacral space. This is nonspecific and may be related to systemic disease. 6. Distended urinary bladder. 7. 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 8. Aortic Atherosclerosis (ICD10-I70.0) and Emphysema (ICD10-J43.9). Electronically Signed   By: Kennith Center M.D.   On: 12/21/2021 17:43   CT Head Wo Contrast  Result Date: 12/21/2021 CLINICAL DATA:  Neck trauma.  Fall. EXAM: CT HEAD WITHOUT CONTRAST  CT CERVICAL SPINE WITHOUT CONTRAST TECHNIQUE: Multidetector CT imaging of the head and cervical spine was performed following the standard protocol without intravenous contrast. Multiplanar CT image reconstructions of the cervical spine were also generated. RADIATION DOSE REDUCTION: This exam was performed according to the departmental dose-optimization program which includes automated exposure control, adjustment of the mA and/or kV according to patient size and/or use of iterative reconstruction technique. COMPARISON:  CT examination dated September 30, 2021 FINDINGS: CT HEAD FINDINGS Brain: No evidence of acute infarction, hemorrhage, hydrocephalus, extra-axial collection or mass lesion/mass effect. Mega cisterna magna. Moderate cerebral volume loss and chronic microvascular ischemic changes of the white matter. Left occipital encephalomalacia suggesting prior infarct, unchanged. Vascular: No hyperdense vessel or unexpected calcification. Skull: Normal. Negative for fracture or focal lesion. Sinuses/Orbits: Patchy opacification of the ethmoid air cells. Other: None. CT CERVICAL SPINE FINDINGS Alignment: Unchanged anterolisthesis of dense secondary to the fracture. Reversal of normal cervical lordosis. Mild retrolisthesis of C5. Skull base and vertebrae: Unchanged appearance of the type 3 dens fracture  with asymmetric extension into the lateral mass of C2 with mild anterior subluxation. Soft tissues and spinal canal: No prevertebral fluid or swelling. No visible canal hematoma. Disc levels: Advanced degenerative disc disease most prominent at C4-C5, C5-C6, unchanged. Moderate spinal canal and neural foraminal stenosis bilaterally at these levels. Upper chest: Emphysematous changes with pleural/parenchymal scarring. Other: None IMPRESSION: CT head: 1.  No acute intracranial abnormality. 2. 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 CT cervical spine: 1. Unchanged appearance of the type 3 odontoid process fracture. No new fracture. 2. 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 No significant soft tissue abnormality or significant interval change. Electronically Signed   By: Larose Hires D.O.   On: 12/21/2021 17:32   CT Cervical Spine Wo Contrast  Result Date: 12/21/2021 CLINICAL DATA:  Neck trauma.  Fall. EXAM: CT HEAD WITHOUT CONTRAST CT CERVICAL SPINE WITHOUT CONTRAST TECHNIQUE: Multidetector CT imaging of the head and cervical spine was performed following the standard protocol without intravenous contrast. Multiplanar CT image reconstructions of the cervical spine were also generated. RADIATION DOSE REDUCTION: This exam was performed according to the departmental dose-optimization program which includes automated exposure control, adjustment of the mA and/or kV according to patient size and/or use of iterative reconstruction technique. COMPARISON:  CT examination dated September 30, 2021 FINDINGS: CT HEAD FINDINGS Brain: No evidence of acute infarction, hemorrhage, hydrocephalus, extra-axial collection or mass lesion/mass effect. Mega cisterna magna. Moderate cerebral volume loss and chronic microvascular ischemic changes of the white matter. Left occipital encephalomalacia  suggesting prior infarct, unchanged. Vascular: No hyperdense vessel or unexpected calcification. Skull: Normal. Negative for fracture or focal lesion. Sinuses/Orbits: Patchy opacification of the ethmoid air cells. Other: None. CT CERVICAL SPINE FINDINGS Alignment: Unchanged anterolisthesis of dense secondary to the fracture. Reversal of normal cervical lordosis. Mild retrolisthesis of C5. Skull base and vertebrae: Unchanged appearance of the type 3 dens fracture with asymmetric extension into the lateral mass of C2 with mild anterior subluxation. Soft tissues and spinal canal: No prevertebral fluid or swelling. No visible canal hematoma. Disc levels: Advanced degenerative disc disease most prominent at C4-C5, C5-C6, unchanged. Moderate spinal canal and neural foraminal stenosis bilaterally at these levels. Upper chest: Emphysematous changes with pleural/parenchymal scarring. Other: None IMPRESSION: CT head: 1.  No acute intracranial abnormality. 2. 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 CT cervical spine: 1. Unchanged appearance of the type 3 odontoid process fracture. No new fracture. 2. 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 No significant soft tissue abnormality or significant interval change. Electronically Signed   By: Larose Hires D.O.   On: 12/21/2021 17:32    Procedures Procedures    Medications Ordered in ED Medications  potassium chloride 10 mEq in 100 mL IVPB (has no administration in time range)  acetaminophen (TYLENOL) tablet 1,000 mg (1,000 mg Oral Given 12/21/21 1726)  lactated ringers bolus 1,000 mL (0 mLs Intravenous Stopped 12/21/21 1825)  iohexol (OMNIPAQUE) 300 MG/ML solution 100 mL (100 mLs Intravenous Contrast Given 12/21/21 1704)  cefTRIAXone (ROCEPHIN) 2 g in sodium chloride 0.9 % 100 mL IVPB (0 g Intravenous Stopped 12/21/21 2006)   azithromycin (ZITHROMAX) 500 mg in sodium chloride 0.9 % 250 mL IVPB (0 mg Intravenous Stopped 12/21/21 1924)    ED Course/ Medical Decision Making/ A&P                           Medical Decision Making Amount and/or Complexity of Data Reviewed Labs: ordered. Radiology: ordered. ECG/medicine tests: ordered.  Risk OTC drugs. Prescription drug management. Decision regarding hospitalization.   76 yo M with a chief complaints of a fall.  Patient is unsure exactly what happened but thinks he slipped.  He thinks it happened about 24 hours ago though he has no signs of prolonged laying on the ground.  Has pretty intact skin diffusely.  He is having some involuntary guarding about the abdomen and so obtain CT imaging.  He has a known C-spine fracture.  We will obtain a CT of the head and C-spine.  On record review it appears that the patient was seen recently for a fall.  Had a dens fracture.  Had followed up with neurosurgery as an outpatient.  Patient was noted to be febrile here to 101.4.  Lactate normal.  CT scan of the chest abdomen pelvis concerning for multifocal pneumonia and inflammation around the duodenum.  Started on antibiotics.  Discussed with medicine for admission.  CRITICAL CARE Performed by: Rae Roam   Total critical care time: 35 minutes  Critical care time was exclusive of separately billable procedures and treating other patients.  Critical care was necessary to treat or prevent imminent or life-threatening deterioration.  Critical care was time spent personally by me on the following activities: development of treatment plan with patient and/or surrogate as well as nursing, discussions with consultants, evaluation of patient's response to treatment, examination of patient, obtaining history from patient or surrogate, ordering and performing treatments and interventions, ordering and review of laboratory studies, ordering and review of radiographic studies,  pulse oximetry and re-evaluation of patient's condition.  The patients results and plan were reviewed and discussed.   Any x-rays performed were independently reviewed by myself.   Differential diagnosis were considered with the presenting HPI.  Medications  potassium chloride 10 mEq in 100 mL IVPB (has no administration in time range)  acetaminophen (TYLENOL) tablet 1,000 mg (1,000 mg Oral Given 12/21/21 1726)  lactated ringers bolus 1,000 mL (0 mLs Intravenous Stopped 12/21/21 1825)  iohexol (OMNIPAQUE) 300 MG/ML solution 100 mL (100 mLs Intravenous Contrast Given 12/21/21 1704)  cefTRIAXone (ROCEPHIN) 2 g in sodium chloride 0.9 % 100 mL IVPB (0 g Intravenous Stopped 12/21/21 2006)  azithromycin (ZITHROMAX) 500  mg in sodium chloride 0.9 % 250 mL IVPB (0 mg Intravenous Stopped 12/21/21 1924)    Vitals:   12/21/21 1845 12/21/21 1915 12/21/21 1945 12/21/21 2011  BP: 126/78 120/75 118/85   Pulse: 84 74 88   Resp: 16 19 (!) 23   Temp:    97.7 F (36.5 C)  TempSrc:    Oral  SpO2: 100% 93% 98%     Final diagnoses:  Multifocal pneumonia    Admission/ observation were discussed with the admitting physician, patient and/or family and they are comfortable with the plan.         Final Clinical Impression(s) / ED Diagnoses Final diagnoses:  Multifocal pneumonia    Rx / DC Orders ED Discharge Orders     None         Melene Plan, DO 12/21/21 2046

## 2021-12-21 NOTE — ED Notes (Signed)
Patient transported to CT 

## 2021-12-21 NOTE — ED Notes (Signed)
Pts daughter Victorino Dike updated by this RN

## 2021-12-22 DIAGNOSIS — I10 Essential (primary) hypertension: Secondary | ICD-10-CM

## 2021-12-22 DIAGNOSIS — R5381 Other malaise: Secondary | ICD-10-CM

## 2021-12-22 DIAGNOSIS — W19XXXA Unspecified fall, initial encounter: Secondary | ICD-10-CM

## 2021-12-22 DIAGNOSIS — Y92009 Unspecified place in unspecified non-institutional (private) residence as the place of occurrence of the external cause: Secondary | ICD-10-CM

## 2021-12-22 DIAGNOSIS — A419 Sepsis, unspecified organism: Principal | ICD-10-CM

## 2021-12-22 LAB — CBC
HCT: 35.1 % — ABNORMAL LOW (ref 39.0–52.0)
Hemoglobin: 12.2 g/dL — ABNORMAL LOW (ref 13.0–17.0)
MCH: 31 pg (ref 26.0–34.0)
MCHC: 34.8 g/dL (ref 30.0–36.0)
MCV: 89.1 fL (ref 80.0–100.0)
Platelets: 160 10*3/uL (ref 150–400)
RBC: 3.94 MIL/uL — ABNORMAL LOW (ref 4.22–5.81)
RDW: 13.6 % (ref 11.5–15.5)
WBC: 6.7 10*3/uL (ref 4.0–10.5)
nRBC: 0 % (ref 0.0–0.2)

## 2021-12-22 LAB — CBG MONITORING, ED
Glucose-Capillary: 113 mg/dL — ABNORMAL HIGH (ref 70–99)
Glucose-Capillary: 117 mg/dL — ABNORMAL HIGH (ref 70–99)
Glucose-Capillary: 127 mg/dL — ABNORMAL HIGH (ref 70–99)
Glucose-Capillary: 184 mg/dL — ABNORMAL HIGH (ref 70–99)

## 2021-12-22 LAB — COMPREHENSIVE METABOLIC PANEL
ALT: 96 U/L — ABNORMAL HIGH (ref 0–44)
AST: 142 U/L — ABNORMAL HIGH (ref 15–41)
Albumin: 2.1 g/dL — ABNORMAL LOW (ref 3.5–5.0)
Alkaline Phosphatase: 43 U/L (ref 38–126)
Anion gap: 9 (ref 5–15)
BUN: 23 mg/dL (ref 8–23)
CO2: 22 mmol/L (ref 22–32)
Calcium: 8.2 mg/dL — ABNORMAL LOW (ref 8.9–10.3)
Chloride: 106 mmol/L (ref 98–111)
Creatinine, Ser: 1.12 mg/dL (ref 0.61–1.24)
GFR, Estimated: >60 mL/min (ref >60)
Glucose, Bld: 105 mg/dL — ABNORMAL HIGH (ref 70–99)
Potassium: 3.2 mmol/L — ABNORMAL LOW (ref 3.5–5.1)
Sodium: 137 mmol/L (ref 135–145)
Total Bilirubin: 0.5 mg/dL (ref 0.3–1.2)
Total Protein: 4.9 g/dL — ABNORMAL LOW (ref 6.5–8.1)

## 2021-12-22 LAB — MAGNESIUM: Magnesium: 2.1 mg/dL (ref 1.7–2.4)

## 2021-12-22 LAB — PROCALCITONIN: Procalcitonin: 2.04 ng/mL

## 2021-12-22 LAB — GLUCOSE, CAPILLARY
Glucose-Capillary: 133 mg/dL — ABNORMAL HIGH (ref 70–99)
Glucose-Capillary: 136 mg/dL — ABNORMAL HIGH (ref 70–99)
Glucose-Capillary: 164 mg/dL — ABNORMAL HIGH (ref 70–99)

## 2021-12-22 MED ORDER — POTASSIUM CHLORIDE CRYS ER 20 MEQ PO TBCR
40.0000 meq | EXTENDED_RELEASE_TABLET | Freq: Once | ORAL | Status: AC
Start: 2021-12-22 — End: 2021-12-22
  Administered 2021-12-22: 40 meq via ORAL
  Filled 2021-12-22: qty 2

## 2021-12-22 MED ORDER — SODIUM CHLORIDE (HYPERTONIC) 5 % OP SOLN
1.0000 [drp] | Freq: Three times a day (TID) | OPHTHALMIC | Status: DC
Start: 2021-12-22 — End: 2021-12-30
  Administered 2021-12-22 – 2021-12-30 (×23): 1 [drp] via OPHTHALMIC
  Filled 2021-12-22: qty 15

## 2021-12-22 MED ORDER — TRAZODONE HCL 50 MG PO TABS
50.0000 mg | ORAL_TABLET | Freq: Every day | ORAL | Status: DC
  Administered 2021-12-22 – 2021-12-29 (×8): 50 mg via ORAL
  Filled 2021-12-22 (×8): qty 1

## 2021-12-22 MED ORDER — ENSURE MAX PROTEIN PO LIQD
Freq: Every day | ORAL | Status: DC
  Administered 2021-12-23: 330 mL via ORAL
  Administered 2021-12-24: 237 mL via ORAL
  Administered 2021-12-26: 330 mL via ORAL

## 2021-12-22 MED ORDER — SODIUM CHLORIDE (HYPERTONIC) 5 % OP OINT
1.0000 | TOPICAL_OINTMENT | Freq: Two times a day (BID) | OPHTHALMIC | Status: DC
  Filled 2021-12-22: qty 3.5

## 2021-12-22 MED ORDER — PAROXETINE HCL 20 MG PO TABS
60.0000 mg | ORAL_TABLET | Freq: Every day | ORAL | Status: DC
  Administered 2021-12-23 – 2021-12-30 (×8): 60 mg via ORAL
  Filled 2021-12-22 (×8): qty 3

## 2021-12-22 MED ORDER — CLONAZEPAM 0.25 MG PO TBDP
1.2500 mg | ORAL_TABLET | Freq: Every day | ORAL | Status: DC
  Administered 2021-12-22 – 2021-12-29 (×8): 1.25 mg via ORAL
  Filled 2021-12-22 (×8): qty 5

## 2021-12-22 MED ORDER — LACTATED RINGERS IV SOLN
INTRAVENOUS | Status: AC
Start: 2021-12-22 — End: 2021-12-23

## 2021-12-22 MED ORDER — PREDNISOLONE ACETATE 1 % OP SUSP
1.0000 [drp] | Freq: Two times a day (BID) | OPHTHALMIC | Status: DC
  Administered 2021-12-22 – 2021-12-30 (×16): 1 [drp] via OPHTHALMIC
  Filled 2021-12-22 (×2): qty 5

## 2021-12-22 MED ORDER — LOSARTAN POTASSIUM 50 MG PO TABS
100.0000 mg | ORAL_TABLET | Freq: Every day | ORAL | Status: DC
Start: 2021-12-23 — End: 2021-12-23
  Filled 2021-12-22: qty 2

## 2021-12-22 MED ORDER — RIVASTIGMINE TARTRATE 1.5 MG PO CAPS
6.0000 mg | ORAL_CAPSULE | Freq: Two times a day (BID) | ORAL | Status: DC
  Administered 2021-12-22 – 2021-12-30 (×15): 6 mg via ORAL
  Filled 2021-12-22 (×18): qty 4

## 2021-12-22 MED ORDER — POTASSIUM CHLORIDE CRYS ER 20 MEQ PO TBCR
40.0000 meq | EXTENDED_RELEASE_TABLET | Freq: Once | ORAL | Status: AC
  Administered 2021-12-22: 40 meq via ORAL
  Filled 2021-12-22: qty 2

## 2021-12-22 MED ORDER — PREGABALIN 100 MG PO CAPS
100.0000 mg | ORAL_CAPSULE | Freq: Two times a day (BID) | ORAL | Status: DC
  Administered 2021-12-22 – 2021-12-30 (×16): 100 mg via ORAL
  Filled 2021-12-22 (×16): qty 1

## 2021-12-22 MED ORDER — TAMSULOSIN HCL 0.4 MG PO CAPS
0.4000 mg | ORAL_CAPSULE | Freq: Every day | ORAL | Status: DC
  Administered 2021-12-22 – 2021-12-30 (×9): 0.4 mg via ORAL
  Filled 2021-12-22 (×9): qty 1

## 2021-12-22 MED ORDER — OXYCODONE HCL 5 MG PO TABS: 5.0000 mg | ORAL_TABLET | Freq: Four times a day (QID) | ORAL | Status: DC | PRN

## 2021-12-22 MED ORDER — MEMANTINE HCL 10 MG PO TABS
20.0000 mg | ORAL_TABLET | Freq: Every day | ORAL | Status: DC
  Administered 2021-12-22 – 2021-12-29 (×8): 20 mg via ORAL
  Filled 2021-12-22 (×8): qty 2

## 2021-12-22 MED ORDER — AMLODIPINE BESYLATE 5 MG PO TABS
5.0000 mg | ORAL_TABLET | Freq: Every day | ORAL | Status: DC
  Administered 2021-12-22: 5 mg via ORAL
  Filled 2021-12-22 (×2): qty 1

## 2021-12-22 MED ORDER — VITAMIN B-12 1000 MCG PO TABS
500.0000 ug | ORAL_TABLET | Freq: Every day | ORAL | Status: DC
Start: 2021-12-23 — End: 2021-12-30
  Administered 2021-12-23 – 2021-12-30 (×8): 500 ug via ORAL
  Filled 2021-12-22 (×8): qty 1

## 2021-12-22 MED ORDER — CLONAZEPAM 1 MG PO TABS: 1.2500 mg | ORAL_TABLET | Freq: Every day | ORAL | Status: DC

## 2021-12-22 MED ORDER — PRAMIPEXOLE DIHYDROCHLORIDE 1 MG PO TABS
1.0000 mg | ORAL_TABLET | Freq: Every day | ORAL | Status: DC
  Administered 2021-12-22 – 2021-12-29 (×8): 1 mg via ORAL
  Filled 2021-12-22 (×9): qty 1

## 2021-12-22 MED ORDER — NAPROXEN 250 MG PO TABS
500.0000 mg | ORAL_TABLET | Freq: Two times a day (BID) | ORAL | Status: DC
  Administered 2021-12-22 – 2021-12-30 (×16): 500 mg via ORAL
  Filled 2021-12-22 (×16): qty 2

## 2021-12-22 NOTE — Progress Notes (Signed)
Pt seen in  room, alert/oriented in no apparent distress.Pt orientated/situated to room, menu provided with instructions. Hospital valuables policy has been discussed with no complaints Bed in lowest position with 3 side rails up, call bell/room phone within reach, and all wheels locked.

## 2021-12-22 NOTE — Hospital Course (Addendum)
76 y.o. male with past medical history of dementia, type 2 diabetes, hypertension  presented to the hospital after he was found down by EMS. The patient was found under his bed and may have been there for over 24 hours.   In the ED, patient was febrile with pulse ox of 90 on 2 L of supplemental oxygen.  He was noted to be tachycardic, tachypneic but had stable blood pressure.  EKG showed normal sinus rhythm.  CT head scan and C-spine was negative.  CT chest showed dense airspace consolidation in the right middle lobe and patchy patient in the right lower lobe suggestive of multifocal pneumonia.  Patient was then admitted hospital for further evaluation and treatment. Treated for multifocal pneumonia.  Unable to reach family/S/P on multiple attempts.  Patient was treated with antibiotics and completed, at this time waiting for placement to skilled nursing facility. At this time patient is medically stable for discharge to skilled nursing facility, [facility] been obtained and will be discharged once wife agreeable.

## 2021-12-22 NOTE — ED Notes (Signed)
Pt given apple sauce

## 2021-12-22 NOTE — ED Notes (Signed)
Pt now axo x3, requesting something to eat. Opyd MD to change order

## 2021-12-22 NOTE — Plan of Care (Signed)

## 2021-12-22 NOTE — Evaluation (Signed)
Physical Therapy Evaluation Patient Details Name: Benjamin Boyle MRN: [id number] DOB: 1946/03/07 Today's Date: 12/22/2021  History of Present Illness  Pt is a pleasant 76 y.o. male with medical history significant for dementia, type 2 diabetes mellitus, hypertension, and odontoid fracture, now presenting to the emergency department after he was found down.  History is very limited due to the patient's clinical condition and inability to reach family. EMS reported that patient was found under his bed and they thought he may have been down for 24 hours. He was given magnesium and calcium prior to arrival in the hospital due to something that this on the cardiac monitor, again details very limited unfortunately. Pt admitted with sepsis due to pneumonia.  Clinical Impression  Pt agreeable to physical therapy evaluation/treatment session. Pt poor historian and will attempt to verify PLOF and home environment etc tomorrow by contacting family. Pt's mobility limited today due to safety concerns as well as no recliner in room. Will progress with +2 assist in upcoming sessions. Pt currently presents with functional limitations secondary to impairments listed in PT problem list. Pt to benefit from skilled, acute care physical therapy interventions to maximize his independence level and quality of life.       Recommendations for follow up therapy are one component of a multi-disciplinary discharge planning process, led by the attending physician.  Recommendations may be updated based on patient status, additional functional criteria and insurance authorization.  Follow Up Recommendations Skilled nursing-short term rehab (<3 hours/day) Can patient physically be transported by private vehicle:  (TBD)    Assistance Recommended at Discharge Frequent or constant Supervision/Assistance  Patient can return home with the following  A lot of help with walking and/or transfers;Two people to help with walking and/or  transfers;A lot of help with bathing/dressing/bathroom;Assist for transportation;Help with stairs or ramp for entrance;Direct supervision/assist for medications management;Direct supervision/assist for financial management;Assistance with cooking/housework    Equipment Recommendations  (TBD)  Recommendations for Other Services  OT consult    Functional Status Assessment Patient has had a recent decline in their functional status and demonstrates the ability to make significant improvements in function in a reasonable and predictable amount of time.     Precautions / Restrictions Precautions Precautions: Fall;Cervical Precaution Comments: monitor sats Required Braces or Orthoses: Cervical Brace Cervical Brace: Hard collar;At all times Restrictions Weight Bearing Restrictions: No      Mobility  Bed Mobility Overal bed mobility: Needs Assistance Bed Mobility: Supine to Sit, Sit to Supine     Supine to sit: Min assist, HOB elevated Sit to supine: Supervision   General bed mobility comments: Pt performed log roll to left when performing supine to sit. Cues provided to maintain cervical precautions and bed rail utilized.    Transfers Overall transfer level: Needs assistance Equipment used: Rolling walker (2 wheels) Transfers: Sit to/from Stand Sit to Stand: Min assist           General transfer comment: Pt performed two sit <> stands from bed with pre-gait activities performed each time (see below). Deferred further mobility due to no recliner in room and safety concerns.    Ambulation/Gait             Pre-gait activities: Pt performed two trials of side steps at EOB with minimal assistance and use of RW. Pt struggled with sequencing and unsteady on his feet- pt would slide L LE on floor but when instructed to pick it up for greater foot clearance he would exaggerate his  step causing L foot to put in contact with R. Vitals were monitored.    Stairs             Wheelchair Mobility    Modified Rankin (Stroke Patients Only)       Balance Overall balance assessment: Needs assistance Sitting-balance support: Bilateral upper extremity supported Sitting balance-Leahy Scale: Poor     Standing balance support: Bilateral upper extremity supported, Reliant on assistive device for balance Standing balance-Leahy Scale: Poor                               Pertinent Vitals/Pain Pain Assessment Pain Assessment: 0-10 Pain Score: 0-No pain Pain Location: cervical Pain Descriptors / Indicators: Other (Comment) (n/a) Pain Intervention(s): Limited activity within patient's tolerance, Monitored during session    Home Living Family/patient expects to be discharged to:: Private residence Living Arrangements: Spouse/significant other Available Help at Discharge: Other (Comment) (pt's wife in rehab and unable to help; pt's DTR able to help some outside work ours) Type of Home: Mobile home Home Access: Stairs to enter Entrance Stairs-Rails: Can reach both Entrance Stairs-Number of Steps: 10   Home Layout: One level Home Equipment: [employer] certified [employer] - single point      Prior Function Prior Level of Function : History of Falls (last six months);Independent/Modified Independent (does not drive)             Mobility Comments: Uses SPC per pt. ADLs Comments: Pt reports independence with basic ADL's. Pt's DTR takes care of his errands for him and helps clean his house for him. Pt does not cook. Pt's DTR takes care of his medications.     Hand Dominance   Dominant Hand: Right    Extremity/Trunk Assessment   Upper Extremity Assessment Upper Extremity Assessment: Generalized weakness    Lower Extremity Assessment Lower Extremity Assessment: Generalized weakness       Communication   Communication: No difficulties  Cognition Arousal/Alertness: Awake/alert Behavior During Therapy: WFL for tasks assessed/performed Overall  Cognitive Status: History of cognitive impairments - at baseline (pt has dementia)                                 General Comments: Pt oriented to person and place but not time or situation. Pt with reduced ability to follow motor sequencing commands.        General Comments General comments (skin integrity, edema, etc.): VSS on 3L O2    Exercises     Assessment/Plan    PT Assessment Patient needs continued PT services  PT Problem List Decreased strength;Decreased balance;Decreased activity tolerance;Decreased mobility;Decreased coordination;Decreased cognition;Decreased knowledge of use of DME;Decreased knowledge of precautions;Decreased safety awareness;Decreased skin integrity       PT Treatment Interventions DME instruction;Gait training;Stair training;Functional mobility training;Therapeutic activities;Therapeutic exercise;Balance training;Neuromuscular re-education;Cognitive remediation;Patient/family education;Wheelchair mobility training    PT Goals (Current goals can be found in the Care Plan section)  Acute Rehab PT Goals Patient Stated Goal: none stated PT Goal Formulation: With patient Time For Goal Achievement: 01/02/22 Potential to Achieve Goals: Fair    Frequency Min 3X/week     Co-evaluation               AM-PAC PT "6 Clicks" Mobility  Outcome Measure Help needed turning from your back to your side while in a flat bed without using bedrails?: A Little Help needed moving from  lying on your back to sitting on the side of a flat bed without using bedrails?: A Little Help needed moving to and from a bed to a chair (including a wheelchair)?: A Lot Help needed standing up from a chair using your arms (e.g., wheelchair or bedside chair)?: A Little Help needed to walk in hospital room?: A Lot Help needed climbing 3-5 steps with a railing? : Total 6 Click Score: 14    End of Session Equipment Utilized During Treatment: Gait  belt;Oxygen Activity Tolerance: Patient limited by fatigue;No increased pain Patient left: in bed;with call bell/phone within reach;with bed alarm set Nurse Communication: Mobility status;Precautions PT Visit Diagnosis: Other abnormalities of gait and mobility (R26.89);History of falling (Z91.81);Muscle weakness (generalized) (M62.81)    Time: 9935-7017 PT Time Calculation (min) (ACUTE ONLY): 29 min   Charges:   PT Evaluation $PT Eval Moderate Complexity: 1 Mod PT Treatments $Therapeutic Activity: 8-22 mins       Tana Coast, PT   [employer] 12/22/2021, 4:23 PM

## 2021-12-22 NOTE — Progress Notes (Signed)
PROGRESS NOTE    Benjamin Boyle  DZH:[id number] DOB: [birth year] DOA: 12/21/2021 PCP: [facility]    Brief Narrative:   Benjamin Boyle is a pleasant 76 y.o. male with past medical history of dementia, type 2 diabetes, hypertension  presented to the hospital after he was found down by EMS. The patient was found under his bed and may have been there for over 24 hours.   In the ED, patient was febrile with pulse ox of 90 on 2 L of supplemental oxygen.  He was noted to be tachycardic, tachypneic but had stable blood pressure.  EKG showed normal sinus rhythm.  CT head scan and C-spine was negative.  CT chest showed dense airspace consolidation in the right middle lobe and patchy patient in the right lower lobe suggestive of multifocal pneumonia.  Patient was then admitted hospital for further evaluation and treatment.  Assessment and plan.  Principal Problem:   Multifocal pneumonia Active Problems:   Acute respiratory failure with hypoxia (HCC)   CAD (coronary artery disease)   Hypertension   Hypokalemia   Elevated transaminase level   Diabetes (HCC)   Duodenitis   Dementia ([place])   Closed type III fracture of odontoid process, sequela   Sepsis ([place])   Fall at home, initial encounter   Debility  Sepsis due to pneumonia; Patient was febrile, tachycardic and hypoxic and was found to be down in the floor.  CT scan showed multifocal pneumonia.  On Rocephin and Zithromax.   Blood cultures negative in less than 12 hours.  Procalcitonin elevated at 2.04.  Follow strep pneumo and legionella antigens, procalcitonin.  On supplemental oxygen.  Will wean as able.  acute hypoxic respiratory failure secondary to pneumonia.  Continue supportive care including antibiotics.  Wean oxygen as able.   Fall,/debility. Unknown cause.  No acute trauma.  CT scans negative.  Physical therapy has seen the patient today and recommended skilled nursing facility placement at this time.   Hypokalemia   Patient was 2.9 on admission.  Potassium 3.2 today.  Continue to replace.  Check levels in AM.   Type II DM  Test hemoglobin A1c was 6.9% in February 2023.  Continue sliding scale insulin for now.  Diabetic diet.  History of odontoid process fracture  - Suffered type 3 odontoid process fracture in April, followed by neurosurgery CT spine stable findings.  Continue cervical collar.  History of dementia  Continue supportive care.   Elevated LFTs - AST is 149 and ALT 95 on admission with normal alk phos and bilirubin.  CT abdomen without any acute findings.  Could be secondary to mild rhabdomyolysis.  Follow LFTs.  Mild rhabdomyolysis likely secondary to fall.  Continue IV fluids.  Check CK levels in AM.  Reassess need for fluids in AM.  CKD II Monitor creatinine on the background of  volume depletion and mild rhabdomyolysis.  Creatinine today at 1.1.   Duodenitis - Duodenal wall-thickening and surrounding edema noted on CT in ED.  Could be artifact.  Abnormal exam benign.  Continue PPI.     DVT prophylaxis: enoxaparin (LOVENOX) injection 40 mg Start: 12/21/21 2100   Code Status:     Code Status: Full Code  Disposition: Skilled nursing facility as per PT evaluation  Status is: Inpatient  Remains inpatient appropriate because: Multifocal pneumonia, IV antibiotic, need for rehabilitation   Family Communication:  I tried to call the patient's daughter multiple times on the phone but was unable to reach her.  Unable to reach the spouse number and mailbox is full.  Consultants:  None  Procedures:  None  Antimicrobials:  Rocephin and azithromycin IV 12/21/21>  Anti-infectives (From admission, onward)    Start     Dose/Rate Route Frequency Ordered Stop   12/22/21 1800  cefTRIAXone (ROCEPHIN) 2 g in sodium chloride 0.9 % 100 mL IVPB        2 g 200 mL/hr over 30 Minutes Intravenous Every 24 hours 12/21/21 2052 12/26/21 1759   12/22/21 1800  azithromycin (ZITHROMAX) 500 mg in  sodium chloride 0.9 % 250 mL IVPB        500 mg 250 mL/hr over 60 Minutes Intravenous Every 24 hours 12/21/21 2052 12/26/21 1759   12/21/21 1800  cefTRIAXone (ROCEPHIN) 2 g in sodium chloride 0.9 % 100 mL IVPB        2 g 200 mL/hr over 30 Minutes Intravenous  Once 12/21/21 1759 12/21/21 2006   12/21/21 1800  azithromycin (ZITHROMAX) 500 mg in sodium chloride 0.9 % 250 mL IVPB        500 mg 250 mL/hr over 60 Minutes Intravenous  Once 12/21/21 1759 12/21/21 1924      Subjective: Today, patient was seen and examined at bedside.  Patient denies any pain, nausea, vomiting, fever, chills or rigor.  Has mild cough.  Objective: Vitals:   12/22/21 1015 12/22/21 1030 12/22/21 1253 12/22/21 1612  BP: 127/75 119/78 133/87 133/87  Pulse: (!) 41 (!) 48 93 93  Resp: $Remo'19 20  20  'DdXiU$ Temp:   98.2 F (36.8 C) 98.2 F (36.8 C)  TempSrc:   Oral Oral  SpO2: (!) 89% 91% 93%   Weight:    62 kg  Height:    '5\' 9"'$  (1.753 m)    Intake/Output Summary (Last 24 hours) at 12/22/2021 1633 Last data filed at 12/22/2021 1600 Gross per 24 hour  Intake 1731.45 ml  Output 600 ml  Net 1131.45 ml   Filed Weights   12/22/21 1612  Weight: 62 kg    Physical Examination: Body mass index is 20.18 kg/m.   General: Thinly built, not in obvious distress, on nasal cannula oxygen HENT:   No scleral pallor or icterus noted. Oral mucosa is moist.  Cervical collar in place.. Chest:   Diminished breath sounds bilaterally.  Coarse breath sounds are heard. CVS: S1 &S2 heard. No murmur.  Regular rate and rhythm. Abdomen: Soft, nontender, nondistended.  Bowel sounds are heard.   Extremities: No cyanosis, clubbing or edema.  Peripheral pulses are palpable. Psych: Alert, awake and communicative, oriented to place and time, normal mood CNS:  No cranial nerve deficits.  Moves all extremities. Skin: Warm and dry.  No rashes noted.  Data Reviewed:   CBC: Recent Labs  Lab 12/21/21 1600 12/21/21 1607 12/22/21 0347  WBC 7.4   --  6.7  NEUTROABS 6.4  --   --   HGB 12.7* 11.9* 12.2*  HCT 38.2* 35.0* 35.1*  MCV 91.2  --  89.1  PLT 173  --  734    Basic Metabolic Panel: Recent Labs  Lab 12/21/21 1600 12/21/21 1607 12/21/21 2154 12/22/21 0347  NA 135 135  --  137  K 2.9* 2.9*  --  3.2*  CL 101 101  --  106  CO2 22  --   --  22  GLUCOSE 130* 126*  --  105*  BUN 23 24*  --  23  CREATININE 1.14 1.00  --  1.12  CALCIUM  8.7*  --   --  8.2*  MG  --   --  2.1 2.1    Liver Function Tests: Recent Labs  Lab 12/21/21 1600 12/22/21 0347  AST 149* 142*  ALT 95* 96*  ALKPHOS 50 43  BILITOT 0.9 0.5  PROT 5.6* 4.9*  ALBUMIN 2.5* 2.1*     Radiology Studies: CT L-SPINE NO CHARGE  Result Date: 12/21/2021 CLINICAL DATA:  [place].  Back pain. EXAM: CT LUMBAR SPINE WITHOUT CONTRAST TECHNIQUE: Multidetector CT imaging of the lumbar spine was performed without intravenous contrast administration. Multiplanar CT image reconstructions were also generated. RADIATION DOSE REDUCTION: This exam was performed according to the departmental dose-optimization program which includes automated exposure control, adjustment of the mA and/or kV according to patient size and/or use of iterative reconstruction technique. COMPARISON:  02/16/2011 FINDINGS: Segmentation: 5 lumbar type vertebral bodies. Alignment: No malalignment. Vertebrae: No fracture in the region from inferior T11 through S4. Distant decompression and fusion at the L3-4 level with pedicle screws and posterior rods. Paraspinal and other soft tissues: Negative Disc levels: No significant disc level pathology at T11-12 or T12-L1. L1-2: Circumferential disc bulge. Mild stenosis of both lateral recesses. L2-3: Circumferential disc bulge. Facet and ligamentous hypertrophy. Multifactorial spinal stenosis that could be significant. L3-4: Previous fusion procedure. Sufficient patency of the canal and foramina. L4-5: Previous posterior decompression. Circumferential disc protrusion.  Potential for significant stenosis at this level. L5-S1: Circumferential disc protrusion more prominent towards the right. Facet arthropathy worse on the right. Stenosis of the lateral recesses and foramina right more than left that could be significant. IMPRESSION: No acute or traumatic finding in the region from inferior T11 through S4. Distant fusion procedure at L3-4 has a satisfactory appearance. Severe multifactorial stenosis at L2-3 and L4-5 that could be significant. Lateral recess and foraminal stenosis right worse than left at L5-S1 that could be significant. Electronically Signed   By: Nelson Chimes M.D.   On: 12/21/2021 18:01   CT CHEST ABDOMEN PELVIS W CONTRAST  Result Date: 12/21/2021 CLINICAL DATA:  Fall.  Head trauma.  Poly trauma.  Found down. EXAM: CT CHEST, ABDOMEN, AND PELVIS WITH CONTRAST TECHNIQUE: Multidetector CT imaging of the chest, abdomen and pelvis was performed following the standard protocol during bolus administration of intravenous contrast. RADIATION DOSE REDUCTION: This exam was performed according to the departmental dose-optimization program which includes automated exposure control, adjustment of the mA and/or kV according to patient size and/or use of iterative reconstruction technique. CONTRAST:  19mL OMNIPAQUE IOHEXOL 300 MG/ML  SOLN COMPARISON:  None Available. FINDINGS: CT CHEST FINDINGS Cardiovascular: The heart size is normal. No substantial pericardial effusion. Coronary artery calcification is evident. Moderate atherosclerotic calcification is noted in the wall of the thoracic aorta. Mediastinum/Nodes: 11 mm short axis precarinal lymph node is borderline enlarged. Adjacent upper normal subcarinal lymph nodes measure up to 10 mm short axis. There is no hilar lymphadenopathy. the esophagus has normal imaging features. There is no axillary lymphadenopathy. Lungs/Pleura: Centrilobular and paraseptal emphysema evident. Dense airspace consolidation identified in the right  middle lobe with patchy and nodular airspace disease identified in the right lower lobe including 3.2 cm nodular component on image 101/6. No suspicious pulmonary nodule or mass in the left lung. Tiny right pleural effusion evident. Musculoskeletal: No worrisome lytic or sclerotic osseous abnormality. 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 CT ABDOMEN PELVIS FINDINGS Hepatobiliary: No suspicious focal abnormality within the liver parenchyma. Gallbladder is nondistended with potential trace pericholecystic fluid.  No intrahepatic or extrahepatic biliary dilation. Pancreas: No focal mass lesion. No dilatation of the main duct. No intraparenchymal cyst. No peripancreatic edema. Spleen: No splenomegaly. No focal mass lesion. Adrenals/Urinary Tract: Thickening of the adrenal glands bilaterally is compatible with hyperplasia. 2.7 cm exophytic cyst noted upper pole left kidney no suspicious enhancing mass lesion identified in either kidney. No evidence for hydroureter. Bladder is distended. Stomach/Bowel: Stomach is unremarkable. No gastric wall thickening. No evidence of outlet obstruction. Duodenal wall appears thickened along the descending and proximal transverse segment with edema/fluid adjacent. No small bowel wall thickening. No small bowel dilatation. The terminal ileum is normal. The appendix is normal. No gross colonic mass. No colonic wall thickening. Vascular/Lymphatic: There is moderate atherosclerotic calcification of the abdominal aorta without aneurysm. There is no gastrohepatic or hepatoduodenal ligament lymphadenopathy. No retroperitoneal or mesenteric lymphadenopathy. No pelvic sidewall lymphadenopathy. Reproductive: The prostate gland and seminal vesicles are unremarkable. Other: Trace free fluid is seen along the inferior liver. Edema seen in the retroperitoneal tissues bilaterally and in the presacral space. Musculoskeletal: No worrisome lytic or sclerotic  osseous abnormality. Lumbar fusion hardware evident. IMPRESSION: 1. Dense airspace consolidation in the right middle lobe with patchy and nodular airspace disease in the right lower lobe. Imaging features are most suggestive of multifocal pneumonia. Given the nodular character of the airspace disease in the right lower lobe, close follow-up recommended to ensure resolution. 2. Borderline mediastinal lymphadenopathy, likely reactive. 3. Wall thickening along the descending and proximal transverse segment of the duodenum with edema/fluid adjacent to the duodenum. Imaging features may be related to an infectious/inflammatory duodenitis. Peptic ulcer disease not excluded. 4. Trace free fluid along the inferior liver. 5. Edema in the retroperitoneal tissues bilaterally and in the presacral space. This is nonspecific and may be related to systemic disease. 6. Distended urinary bladder. 7. 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 8. Aortic Atherosclerosis (ICD10-I70.0) and Emphysema (ICD10-J43.9). Electronically Signed   By: Misty Stanley M.D.   On: 12/21/2021 17:43   CT Head Wo Contrast  Result Date: 12/21/2021 CLINICAL DATA:  Neck trauma.  Fall. EXAM: CT HEAD WITHOUT CONTRAST CT CERVICAL SPINE WITHOUT CONTRAST TECHNIQUE: Multidetector CT imaging of the head and cervical spine was performed following the standard protocol without intravenous contrast. Multiplanar CT image reconstructions of the cervical spine were also generated. RADIATION DOSE REDUCTION: This exam was performed according to the departmental dose-optimization program which includes automated exposure control, adjustment of the mA and/or kV according to patient size and/or use of iterative reconstruction technique. COMPARISON:  CT examination dated September 30, 2021 FINDINGS: CT HEAD FINDINGS Brain: No evidence of acute infarction, hemorrhage, hydrocephalus, extra-axial collection or mass lesion/mass effect. Mega cisterna magna. Moderate  cerebral volume loss and chronic microvascular ischemic changes of the white matter. Left occipital encephalomalacia suggesting prior infarct, unchanged. Vascular: No hyperdense vessel or unexpected calcification. Skull: Normal. Negative for fracture or focal lesion. Sinuses/Orbits: Patchy opacification of the ethmoid air cells. Other: None. CT CERVICAL SPINE FINDINGS Alignment: Unchanged anterolisthesis of dense secondary to the fracture. Reversal of normal cervical lordosis. Mild retrolisthesis of C5. Skull base and vertebrae: Unchanged appearance of the type 3 dens fracture with asymmetric extension into the lateral mass of C2 with mild anterior subluxation. Soft tissues and spinal canal: No prevertebral fluid or swelling. No visible canal hematoma. Disc levels: Advanced degenerative disc disease most prominent at C4-C5, C5-C6, unchanged. Moderate spinal canal and neural foraminal stenosis bilaterally at these levels. Upper chest: Emphysematous changes with pleural/parenchymal scarring.  Other: None IMPRESSION: CT head: 1.  No acute intracranial abnormality. 2. 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 CT cervical spine: 1. Unchanged appearance of the type 3 odontoid process fracture. No new fracture. 2. 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 No significant soft tissue abnormality or significant interval change. Electronically Signed   By: Keane Police D.O.   On: 12/21/2021 17:32   CT Cervical Spine Wo Contrast  Result Date: 12/21/2021 CLINICAL DATA:  Neck trauma.  Fall. EXAM: CT HEAD WITHOUT CONTRAST CT CERVICAL SPINE WITHOUT CONTRAST TECHNIQUE: Multidetector CT imaging of the head and cervical spine was performed following the standard protocol without intravenous contrast. Multiplanar CT image reconstructions of the cervical spine were also generated. RADIATION DOSE  REDUCTION: This exam was performed according to the departmental dose-optimization program which includes automated exposure control, adjustment of the mA and/or kV according to patient size and/or use of iterative reconstruction technique. COMPARISON:  CT examination dated September 30, 2021 FINDINGS: CT HEAD FINDINGS Brain: No evidence of acute infarction, hemorrhage, hydrocephalus, extra-axial collection or mass lesion/mass effect. Mega cisterna magna. Moderate cerebral volume loss and chronic microvascular ischemic changes of the white matter. Left occipital encephalomalacia suggesting prior infarct, unchanged. Vascular: No hyperdense vessel or unexpected calcification. Skull: Normal. Negative for fracture or focal lesion. Sinuses/Orbits: Patchy opacification of the ethmoid air cells. Other: None. CT CERVICAL SPINE FINDINGS Alignment: Unchanged anterolisthesis of dense secondary to the fracture. Reversal of normal cervical lordosis. Mild retrolisthesis of C5. Skull base and vertebrae: Unchanged appearance of the type 3 dens fracture with asymmetric extension into the lateral mass of C2 with mild anterior subluxation. Soft tissues and spinal canal: No prevertebral fluid or swelling. No visible canal hematoma. Disc levels: Advanced degenerative disc disease most prominent at C4-C5, C5-C6, unchanged. Moderate spinal canal and neural foraminal stenosis bilaterally at these levels. Upper chest: Emphysematous changes with pleural/parenchymal scarring. Other: None IMPRESSION: CT head: 1.  No acute intracranial abnormality. 2. 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 CT cervical spine: 1. Unchanged appearance of the type 3 odontoid process fracture. No new fracture. 2. 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 No significant soft tissue abnormality or significant interval change.  Electronically Signed   By: Keane Police D.O.   On: 12/21/2021 17:32      LOS: 1 day    Flora Lipps, MD [facility] Available via Epic secure chat 7am-7pm After these hours, please refer to coverage provider listed on [email or web address] 12/22/2021, 4:33 PM

## 2021-12-23 LAB — PROCALCITONIN: Procalcitonin: 1.26 ng/mL

## 2021-12-23 LAB — LEGIONELLA PNEUMOPHILA SEROGP 1 UR AG: L. pneumophila Serogp 1 Ur Ag: NEGATIVE

## 2021-12-23 LAB — CBC
HCT: 31.1 % — ABNORMAL LOW (ref 39.0–52.0)
Hemoglobin: 10.8 g/dL — ABNORMAL LOW (ref 13.0–17.0)
MCH: 30.9 pg (ref 26.0–34.0)
MCHC: 34.7 g/dL (ref 30.0–36.0)
MCV: 89.1 fL (ref 80.0–100.0)
Platelets: 153 10*3/uL (ref 150–400)
RBC: 3.49 MIL/uL — ABNORMAL LOW (ref 4.22–5.81)
RDW: 14.1 % (ref 11.5–15.5)
WBC: 5.2 10*3/uL (ref 4.0–10.5)
nRBC: 0 % (ref 0.0–0.2)

## 2021-12-23 LAB — COMPREHENSIVE METABOLIC PANEL
ALT: 86 U/L — ABNORMAL HIGH (ref 0–44)
AST: 103 U/L — ABNORMAL HIGH (ref 15–41)
Albumin: 1.7 g/dL — ABNORMAL LOW (ref 3.5–5.0)
Alkaline Phosphatase: 41 U/L (ref 38–126)
Anion gap: 8 (ref 5–15)
BUN: 24 mg/dL — ABNORMAL HIGH (ref 8–23)
CO2: 20 mmol/L — ABNORMAL LOW (ref 22–32)
Calcium: 7.9 mg/dL — ABNORMAL LOW (ref 8.9–10.3)
Chloride: 107 mmol/L (ref 98–111)
Creatinine, Ser: 0.99 mg/dL (ref 0.61–1.24)
GFR, Estimated: >60 mL/min (ref >60)
Glucose, Bld: 130 mg/dL — ABNORMAL HIGH (ref 70–99)
Potassium: 3.6 mmol/L (ref 3.5–5.1)
Sodium: 135 mmol/L (ref 135–145)
Total Bilirubin: 0.5 mg/dL (ref 0.3–1.2)
Total Protein: 4.2 g/dL — ABNORMAL LOW (ref 6.5–8.1)

## 2021-12-23 LAB — GLUCOSE, CAPILLARY
Glucose-Capillary: 116 mg/dL — ABNORMAL HIGH (ref 70–99)
Glucose-Capillary: 146 mg/dL — ABNORMAL HIGH (ref 70–99)
Glucose-Capillary: 129 mg/dL — ABNORMAL HIGH (ref 70–99)
Glucose-Capillary: 111 mg/dL — ABNORMAL HIGH (ref 70–99)
Glucose-Capillary: 162 mg/dL — ABNORMAL HIGH (ref 70–99)
Glucose-Capillary: 139 mg/dL — ABNORMAL HIGH (ref 70–99)

## 2021-12-23 LAB — CK: Total CK: 529 U/L — ABNORMAL HIGH (ref 49–397)

## 2021-12-23 MED ORDER — LOSARTAN POTASSIUM 50 MG PO TABS
100.0000 mg | ORAL_TABLET | Freq: Every day | ORAL | Status: DC
Start: 2021-12-24 — End: 2021-12-25
  Administered 2021-12-24 – 2021-12-25 (×2): 100 mg via ORAL
  Filled 2021-12-23 (×2): qty 2

## 2021-12-23 MED ORDER — MOMETASONE FURO-FORMOTEROL FUM 200-5 MCG/ACT IN AERO
2.0000 | INHALATION_SPRAY | Freq: Two times a day (BID) | RESPIRATORY_TRACT | Status: DC
  Administered 2021-12-23 – 2021-12-30 (×14): 2 via RESPIRATORY_TRACT
  Filled 2021-12-23: qty 8.8

## 2021-12-23 MED ORDER — PANTOPRAZOLE SODIUM 40 MG PO TBEC
40.0000 mg | DELAYED_RELEASE_TABLET | ORAL | Status: DC
  Administered 2021-12-23 – 2021-12-29 (×7): 40 mg via ORAL
  Filled 2021-12-23 (×7): qty 1

## 2021-12-23 MED ORDER — SODIUM CHLORIDE 0.9 % IV BOLUS
250.0000 mL | Freq: Once | INTRAVENOUS | Status: AC
  Administered 2021-12-23: 250 mL via INTRAVENOUS

## 2021-12-23 MED ORDER — POTASSIUM CHLORIDE CRYS ER 20 MEQ PO TBCR
20.0000 meq | EXTENDED_RELEASE_TABLET | Freq: Once | ORAL | Status: AC
  Administered 2021-12-23: 20 meq via ORAL
  Filled 2021-12-23: qty 1

## 2021-12-23 MED ORDER — IPRATROPIUM-ALBUTEROL 0.5-2.5 (3) MG/3ML IN SOLN: 3.0000 mL | RESPIRATORY_TRACT | Status: DC | PRN

## 2021-12-23 MED ORDER — AZITHROMYCIN 500 MG PO TABS
500.0000 mg | ORAL_TABLET | ORAL | Status: AC
  Administered 2021-12-23 – 2021-12-25 (×3): 500 mg via ORAL
  Filled 2021-12-23 (×4): qty 1

## 2021-12-23 MED ORDER — UMECLIDINIUM BROMIDE 62.5 MCG/ACT IN AEPB
1.0000 | INHALATION_SPRAY | Freq: Every day | RESPIRATORY_TRACT | Status: DC
Start: 2021-12-23 — End: 2021-12-30
  Administered 2021-12-25 – 2021-12-30 (×6): 1 via RESPIRATORY_TRACT
  Filled 2021-12-23: qty 7

## 2021-12-23 MED ORDER — AMLODIPINE BESYLATE 5 MG PO TABS
5.0000 mg | ORAL_TABLET | Freq: Every day | ORAL | Status: DC
  Administered 2021-12-24 – 2021-12-30 (×7): 5 mg via ORAL
  Filled 2021-12-23 (×7): qty 1

## 2021-12-23 MED ORDER — LACTATED RINGERS IV SOLN: INTRAVENOUS | Status: AC

## 2021-12-23 MED ORDER — TIOTROPIUM BROMIDE MONOHYDRATE 18 MCG IN CAPS: 18.0000 ug | ORAL_CAPSULE | Freq: Every day | RESPIRATORY_TRACT | Status: DC

## 2021-12-23 NOTE — Progress Notes (Signed)
Physical Therapy Treatment Patient Details Name: Benjamin Boyle MRN: [id number] DOB: April 21, 1946 Today's Date: 12/23/2021   History of Present Illness Pt is a pleasant 76 y.o. male with medical history significant for dementia, type 2 diabetes mellitus, hypertension, and odontoid fracture, now presenting to the emergency department after he was found down.  History is very limited due to the patient's clinical condition and inability to reach family. EMS reported that patient was found under his bed and they thought he may have been down for 24 hours. He was given magnesium and calcium prior to arrival in the hospital due to something that this on the cardiac monitor, again details very limited unfortunately. Pt admitted with sepsis due to pneumonia.    PT Comments    Pt instructed in and performed therapeutic activities. Pt able to ambulate short distance to chair with RW but very unsteady and a high risk for falls. Pt talking to his wife upon arrival and therapist spoke with her regarding SNF recommendation. Pt's wife wants to take pt home despite pt's functional status with 2 person assist. Pt's wife going to verify if elevator is working. Pt's wife states pt will have 24/7 assistance from her, son, and daughter. Gait belt provided to pt. Will progress as tolerated.  Recommendations for follow up therapy are one component of a multi-disciplinary discharge planning process, led by the attending physician.  Recommendations may be updated based on patient status, additional functional criteria and insurance authorization.  Follow Up Recommendations  Home health PT (Family wants to take pt home vs SNF) Can patient physically be transported by private vehicle: Yes   Assistance Recommended at Discharge Frequent or constant Supervision/Assistance  Patient can return home with the following A lot of help with walking and/or transfers;Two people to help with walking and/or transfers;A lot of help with  bathing/dressing/bathroom;Assist for transportation;Help with stairs or ramp for entrance;Direct supervision/assist for medications management;Direct supervision/assist for financial management;Assistance with cooking/housework   Equipment Recommendations  None recommended by PT - has all needs per pt's wife including rollator, RW, wheelchair, and Art gallery manager   Recommendations for Other Services OT consult     Precautions / Restrictions Precautions Precautions: Fall;Cervical Precaution Comments: monitor sats Required Braces or Orthoses: Cervical Brace Cervical Brace: Hard collar;At all times Restrictions Weight Bearing Restrictions: No     Mobility  Bed Mobility Overal bed mobility: Needs Assistance Bed Mobility: Supine to Sit, Sit to Supine     Supine to sit: HOB elevated, Mod assist Sit to supine: Supervision   General bed mobility comments: Pt performed log roll to left when performing supine to sit. Cues provided to maintain cervical precautions and bed rail utilized. Also required sling pad. Pt slow to initiate and required increased time.    Transfers Overall transfer level: Needs assistance Equipment used: Rolling walker (2 wheels) Transfers: Sit to/from Stand, Bed to chair/wheelchair/BSC Sit to Stand: Mod assist, +2 safety/equipment   Step pivot transfers: Mod assist, +2 safety/equipment       General transfer comment: Pt with difficulty on initial sit to stand and unable to complete. Bed elevated afterwards and pt able to stand to perform side steps at bedside prior to gait/step pivot transfer to chair. Pt unsteady on feet.    Ambulation/Gait Ambulation/Gait assistance: Mod assist, +2 safety/equipment Gait Distance (Feet): 6 Feet Assistive device: Rolling walker (2 wheels) Gait Pattern/deviations: Step-to pattern, Decreased step length - right, Decreased step length - left, Ataxic   Gait velocity interpretation: <1.31 ft/sec, indicative  of household  ambulator   General Gait Details: Pt with poor coordination of LE's when stepping and unsteady. Pt ambulated short distance before pivoting to chair. Pt required cues for sequencing and technique.   Stairs             Wheelchair Mobility    Modified Rankin (Stroke Patients Only)       Balance Overall balance assessment: Needs assistance Sitting-balance support: Bilateral upper extremity supported Sitting balance-Leahy Scale: Poor     Standing balance support: Bilateral upper extremity supported, Reliant on assistive device for balance Standing balance-Leahy Scale: Poor                              Cognition Arousal/Alertness: Awake/alert Behavior During Therapy: Flat affect Overall Cognitive Status: History of cognitive impairments - at baseline (pt has dementia)                                 General Comments: Pt with slow processing and intermittent confusion. Pt difficult to understand.        Exercises      General Comments General comments (skin integrity, edema, etc.): VSS on RA      Pertinent Vitals/Pain Pain Assessment Pain Assessment: No/denies pain Pain Location: cervical Pain Descriptors / Indicators: Other (Comment) (n/a)    Home Living                          Prior Function            PT Goals (current goals can now be found in the care plan section) Acute Rehab PT Goals Patient Stated Goal: none stated PT Goal Formulation: With patient Time For Goal Achievement: 01/02/22 Potential to Achieve Goals: Fair Progress towards PT goals: Progressing toward goals (slow and limited)    Frequency    Min 3X/week      PT Plan Current plan remains appropriate    Co-evaluation              AM-PAC PT "6 Clicks" Mobility   Outcome Measure  Help needed turning from your back to your side while in a flat bed without using bedrails?: A Lot Help needed moving from lying on your back to sitting on  the side of a flat bed without using bedrails?: A Lot Help needed moving to and from a bed to a chair (including a wheelchair)?: A Lot Help needed standing up from a chair using your arms (e.g., wheelchair or bedside chair)?: A Lot Help needed to walk in hospital room?: A Lot Help needed climbing 3-5 steps with a railing? : Total 6 Click Score: 11    End of Session Equipment Utilized During Treatment: Gait belt Activity Tolerance: Patient limited by fatigue;No increased pain Patient left: with call bell/phone within reach;in chair;with chair alarm set Nurse Communication: Mobility status;Precautions PT Visit Diagnosis: Other abnormalities of gait and mobility (R26.89);History of falling (Z91.81);Muscle weakness (generalized) (M62.81)     Time: 0093-8182 PT Time Calculation (min) (ACUTE ONLY): 25 min  Charges:  $Therapeutic Activity: 23-37 mins                     Tana Coast, PT    [employer] 12/23/2021, 10:48 AM

## 2021-12-23 NOTE — Progress Notes (Signed)
PROGRESS NOTE    GAYLORD CARRINO  [id number] DOB: [birth year] DOA: 12/21/2021 PCP: [facility]    Chief Complaint  Patient presents with   Fall    Brief Narrative:  Benjamin Boyle is a pleasant 76 y.o. male with past medical history of dementia, type 2 diabetes, hypertension  presented to the hospital after he was found down by EMS. The patient was found under his bed and may have been there for over 24 hours.   In the ED, patient was febrile with pulse ox of 90 on 2 L of supplemental oxygen.  He was noted to be tachycardic, tachypneic but had stable blood pressure.  EKG showed normal sinus rhythm.  CT head scan and C-spine was negative.  CT chest showed dense airspace consolidation in the right middle lobe and patchy patient in the right lower lobe suggestive of multifocal pneumonia.  Patient was then admitted hospital for further evaluation and treatment.   Assessment & Plan:   Principal Problem:   Multifocal pneumonia Active Problems:   Acute respiratory failure with hypoxia (HCC)   CAD (coronary artery disease)   Hypertension   Hypokalemia   Elevated transaminase level   Diabetes (HCC)   Duodenitis   Dementia (HCC)   Closed type III fracture of odontoid process, sequela   Sepsis ([place])   Fall at home, initial encounter   Debility  #1 sepsis secondary to multifocal pneumonia, POA -Patient presented noted to be febrile, tachycardic, hypoxic found to be down on the floor. -CT chest done concerning for multifocal pneumonia. -Urine strep pneumococcus antigen negative, urine Legionella antigen negative. -Blood cultures with no growth to date x2 days. -Sputum Gram stain and cultures pending. -Influenza A/B PCR negative, SARS coronavirus 2 PCR negative. -Procalcitonin noted elevated at 2.09 trended down currently at 1.26. -Hypoxia improving currently with sats of 92 to 94% on room air -Continue IV Rocephin, transition azithromycin to oral azithromycin. -Place on  Incruse, Dulera.  Albuterol as needed. -Supportive care.  2.  Acute hypoxic respiratory failure secondary to pneumonia -See problem #1. -Continue empiric IV antibiotics. -Place on Dulera, Spiriva, albuterol as needed. -Continue to wean O2.  3.  Fall/Debility -Unknown etiology -No acute trauma noted. -CT head CT C-spine with no acute abnormalities. -Patient seen by PT who are recommending SNF however family prefers to take patient home per PT note. -If patient indeed goes home will need home health ordered.  4.  Hypokalemia -Repleted.  Potassium at 3.6. -Repeat labs in the AM.  5.  Diabetes mellitus type 2 -Last hemoglobin A1c 6.9% 07/2021. -SSI.  6.  History of odontoid process fracture -Patient noted to have suffered a type III odontoid process fracture in April followed by neurosurgery. -CT C-spine and head with stable findings noted. -Currently with a cervical collar will change to Aspen collar.  7.  Dementia -Supportive care.  8.  Transaminitis -Patient noted with elevated LFTs felt secondary to mild rhabdomyolysis. -CT abdomen and pelvis with no acute findings. -LFTs trending down. -Continue IV fluids.  9.  Mild rhabdomyolysis -Felt likely secondary to fall and found on the floor. -CK levels trending down currently at 529 from 1831. -Continue IV fluids  10.  CKD stage II -Renal function stable. -IV fluids.  11.  Duodenitis -Duodenal wall thickening and surrounding edema noted in CT. -Abdominal exam benign. -PPI.      DVT prophylaxis: Lovenox Code Status: Full Family Communication: Updated patient.  No family at bedside. Disposition: Home health PT versus  SNF  Status is: Inpatient Remains inpatient appropriate because: Severity of illness   Consultants:  None  Procedures:  CT head CT C-spine 12/21/2021 CT chest abdomen and pelvis 12/21/2021 CT L-spine 12/21/2021  Antimicrobials:  Oral azithromycin 12/23/2021>>>> 12/26/2021 IV azithromycin  12/21/2021>>>> 12/23/2021 IV Rocephin 12/21/2021>>>>>>   Subjective: Laying in bed.  Cervical collar on.  No chest pain.  No shortness of breath.  Poor urine output per RN  Objective: Vitals:   12/22/21 1612 12/22/21 2100 12/23/21 0341 12/23/21 0800  BP: 133/87 122/73 106/67 104/72  Pulse: 93 78 61 62  Resp: 20 17 20    Temp: 98.2 F (36.8 C) 97.9 F (36.6 C) 98 F (36.7 C) 98.2 F (36.8 C)  TempSrc: Oral Oral Oral Oral  SpO2:  92% 93% 94%  Weight: 62 kg     Height: 5\' 9"  (1.753 m)       Intake/Output Summary (Last 24 hours) at 12/23/2021 1233 Last data filed at 12/23/2021 0354 Gross per 24 hour  Intake 1700.73 ml  Output 520 ml  Net 1180.73 ml   Filed Weights   12/22/21 1612  Weight: 62 kg    Examination:  General exam: Appears calm and comfortable.  Dry mucous membranes.  Cervical collar in place Respiratory system: Clear to auscultation anterior lung fields.  No wheezes, no crackles, fair air movement. Respiratory effort normal. Cardiovascular system: S1 & S2 heard, RRR. No JVD, murmurs, rubs, gallops or clicks. No pedal edema. Gastrointestinal system: Abdomen is nondistended, soft and nontender. No organomegaly or masses felt. Normal bowel sounds heard. Central nervous system: Alert and oriented. No focal neurological deficits. Extremities: Symmetric 5 x 5 power. Skin: No rashes, lesions or ulcers Psychiatry: Judgement and insight appear normal. Mood & affect appropriate.     Data Reviewed: I have personally reviewed following labs and imaging studies  CBC: Recent Labs  Lab 12/21/21 1600 12/21/21 1607 12/22/21 0347 12/23/21 0156  WBC 7.4  --  6.7 5.2  NEUTROABS 6.4  --   --   --   HGB 12.7* 11.9* 12.2* 10.8*  HCT 38.2* 35.0* 35.1* 31.1*  MCV 91.2  --  89.1 89.1  PLT 173  --  160 0000000    Basic Metabolic Panel: Recent Labs  Lab 12/21/21 1600 12/21/21 1607 12/21/21 2154 12/22/21 0347 12/23/21 0156  NA 135 135  --  137 135  K 2.9* 2.9*  --  3.2* 3.6  CL  101 101  --  106 107  CO2 22  --   --  22 20*  GLUCOSE 130* 126*  --  105* 130*  BUN 23 24*  --  23 24*  CREATININE 1.14 1.00  --  1.12 0.99  CALCIUM 8.7*  --   --  8.2* 7.9*  MG  --   --  2.1 2.1  --     GFR: Estimated Creatinine Clearance: 57.4 mL/min (by C-G formula based on SCr of 0.99 mg/dL).  Liver Function Tests: Recent Labs  Lab 12/21/21 1600 12/22/21 0347 12/23/21 0156  AST 149* 142* 103*  ALT 95* 96* 86*  ALKPHOS 50 43 41  BILITOT 0.9 0.5 0.5  PROT 5.6* 4.9* 4.2*  ALBUMIN 2.5* 2.1* 1.7*    CBG: Recent Labs  Lab 12/22/21 2118 12/22/21 2353 12/23/21 0451 12/23/21 0801 12/23/21 1131  GLUCAP 133* 136* 116* 146* 129*     Recent Results (from the past 240 hour(s))  Resp Panel by RT-PCR (Flu A&B, Covid) Anterior Nasal Swab  Status: None   Collection Time: 12/21/21  6:01 PM   Specimen: Anterior Nasal Swab  Result Value Ref Range Status   SARS Coronavirus 2 by RT PCR NEGATIVE NEGATIVE Final    Comment: (NOTE) SARS-CoV-2 target nucleic acids are NOT DETECTED.  The SARS-CoV-2 RNA is generally detectable in upper respiratory specimens during the acute phase of infection. The lowest concentration of SARS-CoV-2 viral copies this assay can detect is 138 copies/mL. A negative result does not preclude SARS-Cov-2 infection and should not be used as the sole basis for treatment or other patient management decisions. A negative result may occur with  improper specimen collection/handling, submission of specimen other than nasopharyngeal swab, presence of viral mutation(s) within the areas targeted by this assay, and inadequate number of viral copies(<138 copies/mL). A negative result must be combined with clinical observations, patient history, and epidemiological information. The expected result is Negative.  Fact Sheet for Patients:  EntrepreneurPulse.com.au  Fact Sheet for Healthcare Providers:   IncredibleEmployment.be  This test is no t yet approved or cleared by the [place] FDA and  has been authorized for detection and/or diagnosis of SARS-CoV-2 by FDA under an Emergency Use Authorization (EUA). This EUA will remain  in effect (meaning this test can be used) for the duration of the COVID-19 declaration under Section 564(b)(1) of the Act, 21 U.S.C.section 360bbb-3(b)(1), unless the authorization is terminated  or revoked sooner.       Influenza A by PCR NEGATIVE NEGATIVE Final   Influenza B by PCR NEGATIVE NEGATIVE Final    Comment: (NOTE) The Xpert Xpress SARS-CoV-2/FLU/RSV plus assay is intended as an aid in the diagnosis of influenza from Nasopharyngeal swab specimens and should not be used as a sole basis for treatment. Nasal washings and aspirates are unacceptable for Xpert Xpress SARS-CoV-2/FLU/RSV testing.  Fact Sheet for Patients: EntrepreneurPulse.com.au  Fact Sheet for Healthcare Providers: IncredibleEmployment.be  This test is not yet approved or cleared by the [place] FDA and has been authorized for detection and/or diagnosis of SARS-CoV-2 by FDA under an Emergency Use Authorization (EUA). This EUA will remain in effect (meaning this test can be used) for the duration of the COVID-19 declaration under Section 564(b)(1) of the Act, 21 U.S.C. section 360bbb-3(b)(1), unless the authorization is terminated or revoked.  Performed at [facility], [place] [address] [place], [place] [address]   Blood culture (routine x 2)     Status: None (Preliminary result)   Collection Time: 12/21/21  6:17 PM   Specimen: BLOOD  Result Value Ref Range Status   Specimen Description BLOOD SITE NOT SPECIFIED  Final   Special Requests   Final    BOTTLES DRAWN AEROBIC AND ANAEROBIC Blood Culture adequate volume   Culture   Final    NO GROWTH < 12 HOURS Performed at [facility], [place] [address] [place], [place] [address]    Report Status PENDING  Incomplete  Blood culture (routine x 2)     Status: None (Preliminary result)   Collection Time: 12/21/21  6:18 PM   Specimen: BLOOD  Result Value Ref Range Status   Specimen Description BLOOD SITE NOT SPECIFIED  Final   Special Requests   Final    BOTTLES DRAWN AEROBIC AND ANAEROBIC Blood Culture adequate volume   Culture   Final    NO GROWTH < 12 HOURS Performed at [facility], [place] [address], [place], [place] [address]    Report Status PENDING  Incomplete  Radiology Studies: CT L-SPINE NO CHARGE  Result Date: 12/21/2021 CLINICAL DATA:  [place].  Back pain. EXAM: CT LUMBAR SPINE WITHOUT CONTRAST TECHNIQUE: Multidetector CT imaging of the lumbar spine was performed without intravenous contrast administration. Multiplanar CT image reconstructions were also generated. RADIATION DOSE REDUCTION: This exam was performed according to the departmental dose-optimization program which includes automated exposure control, adjustment of the mA and/or kV according to patient size and/or use of iterative reconstruction technique. COMPARISON:  02/16/2011 FINDINGS: Segmentation: 5 lumbar type vertebral bodies. Alignment: No malalignment. Vertebrae: No fracture in the region from inferior T11 through S4. Distant decompression and fusion at the L3-4 level with pedicle screws and posterior rods. Paraspinal and other soft tissues: Negative Disc levels: No significant disc level pathology at T11-12 or T12-L1. L1-2: Circumferential disc bulge. Mild stenosis of both lateral recesses. L2-3: Circumferential disc bulge. Facet and ligamentous hypertrophy. Multifactorial spinal stenosis that could be significant. L3-4: Previous fusion procedure. Sufficient patency of the canal and foramina. L4-5: Previous posterior decompression. Circumferential disc protrusion. Potential for significant stenosis at this level. L5-S1: Circumferential disc protrusion more  prominent towards the right. Facet arthropathy worse on the right. Stenosis of the lateral recesses and foramina right more than left that could be significant. IMPRESSION: No acute or traumatic finding in the region from inferior T11 through S4. Distant fusion procedure at L3-4 has a satisfactory appearance. Severe multifactorial stenosis at L2-3 and L4-5 that could be significant. Lateral recess and foraminal stenosis right worse than left at L5-S1 that could be significant. Electronically Signed   By: Nelson Chimes M.D.   On: 12/21/2021 18:01   CT CHEST ABDOMEN PELVIS W CONTRAST  Result Date: 12/21/2021 CLINICAL DATA:  Fall.  Head trauma.  Poly trauma.  Found down. EXAM: CT CHEST, ABDOMEN, AND PELVIS WITH CONTRAST TECHNIQUE: Multidetector CT imaging of the chest, abdomen and pelvis was performed following the standard protocol during bolus administration of intravenous contrast. RADIATION DOSE REDUCTION: This exam was performed according to the departmental dose-optimization program which includes automated exposure control, adjustment of the mA and/or kV according to patient size and/or use of iterative reconstruction technique. CONTRAST:  156mL OMNIPAQUE IOHEXOL 300 MG/ML  SOLN COMPARISON:  None Available. FINDINGS: CT CHEST FINDINGS Cardiovascular: The heart size is normal. No substantial pericardial effusion. Coronary artery calcification is evident. Moderate atherosclerotic calcification is noted in the wall of the thoracic aorta. Mediastinum/Nodes: 11 mm short axis precarinal lymph node is borderline enlarged. Adjacent upper normal subcarinal lymph nodes measure up to 10 mm short axis. There is no hilar lymphadenopathy. the esophagus has normal imaging features. There is no axillary lymphadenopathy. Lungs/Pleura: Centrilobular and paraseptal emphysema evident. Dense airspace consolidation identified in the right middle lobe with patchy and nodular airspace disease identified in the right lower lobe  including 3.2 cm nodular component on image 101/6. No suspicious pulmonary nodule or mass in the left lung. Tiny right pleural effusion evident. Musculoskeletal: No worrisome lytic or sclerotic osseous abnormality. 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 CT ABDOMEN PELVIS FINDINGS Hepatobiliary: No suspicious focal abnormality within the liver parenchyma. Gallbladder is nondistended with potential trace pericholecystic fluid. No intrahepatic or extrahepatic biliary dilation. Pancreas: No focal mass lesion. No dilatation of the main duct. No intraparenchymal cyst. No peripancreatic edema. Spleen: No splenomegaly. No focal mass lesion. Adrenals/Urinary Tract: Thickening of the adrenal glands bilaterally is compatible with hyperplasia. 2.7 cm exophytic cyst noted upper pole left kidney no suspicious enhancing mass lesion identified in either kidney.  No evidence for hydroureter. Bladder is distended. Stomach/Bowel: Stomach is unremarkable. No gastric wall thickening. No evidence of outlet obstruction. Duodenal wall appears thickened along the descending and proximal transverse segment with edema/fluid adjacent. No small bowel wall thickening. No small bowel dilatation. The terminal ileum is normal. The appendix is normal. No gross colonic mass. No colonic wall thickening. Vascular/Lymphatic: There is moderate atherosclerotic calcification of the abdominal aorta without aneurysm. There is no gastrohepatic or hepatoduodenal ligament lymphadenopathy. No retroperitoneal or mesenteric lymphadenopathy. No pelvic sidewall lymphadenopathy. Reproductive: The prostate gland and seminal vesicles are unremarkable. Other: Trace free fluid is seen along the inferior liver. Edema seen in the retroperitoneal tissues bilaterally and in the presacral space. Musculoskeletal: No worrisome lytic or sclerotic osseous abnormality. Lumbar fusion hardware evident. IMPRESSION: 1. Dense airspace  consolidation in the right middle lobe with patchy and nodular airspace disease in the right lower lobe. Imaging features are most suggestive of multifocal pneumonia. Given the nodular character of the airspace disease in the right lower lobe, close follow-up recommended to ensure resolution. 2. Borderline mediastinal lymphadenopathy, likely reactive. 3. Wall thickening along the descending and proximal transverse segment of the duodenum with edema/fluid adjacent to the duodenum. Imaging features may be related to an infectious/inflammatory duodenitis. Peptic ulcer disease not excluded. 4. Trace free fluid along the inferior liver. 5. Edema in the retroperitoneal tissues bilaterally and in the presacral space. This is nonspecific and may be related to systemic disease. 6. Distended urinary bladder. 7. 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 8. Aortic Atherosclerosis (ICD10-I70.0) and Emphysema (ICD10-J43.9). Electronically Signed   By: Kennith Center M.D.   On: 12/21/2021 17:43   CT Head Wo Contrast  Result Date: 12/21/2021 CLINICAL DATA:  Neck trauma.  Fall. EXAM: CT HEAD WITHOUT CONTRAST CT CERVICAL SPINE WITHOUT CONTRAST TECHNIQUE: Multidetector CT imaging of the head and cervical spine was performed following the standard protocol without intravenous contrast. Multiplanar CT image reconstructions of the cervical spine were also generated. RADIATION DOSE REDUCTION: This exam was performed according to the departmental dose-optimization program which includes automated exposure control, adjustment of the mA and/or kV according to patient size and/or use of iterative reconstruction technique. COMPARISON:  CT examination dated September 30, 2021 FINDINGS: CT HEAD FINDINGS Brain: No evidence of acute infarction, hemorrhage, hydrocephalus, extra-axial collection or mass lesion/mass effect. Mega cisterna magna. Moderate cerebral volume loss and chronic microvascular ischemic changes of the white matter. Left  occipital encephalomalacia suggesting prior infarct, unchanged. Vascular: No hyperdense vessel or unexpected calcification. Skull: Normal. Negative for fracture or focal lesion. Sinuses/Orbits: Patchy opacification of the ethmoid air cells. Other: None. CT CERVICAL SPINE FINDINGS Alignment: Unchanged anterolisthesis of dense secondary to the fracture. Reversal of normal cervical lordosis. Mild retrolisthesis of C5. Skull base and vertebrae: Unchanged appearance of the type 3 dens fracture with asymmetric extension into the lateral mass of C2 with mild anterior subluxation. Soft tissues and spinal canal: No prevertebral fluid or swelling. No visible canal hematoma. Disc levels: Advanced degenerative disc disease most prominent at C4-C5, C5-C6, unchanged. Moderate spinal canal and neural foraminal stenosis bilaterally at these levels. Upper chest: Emphysematous changes with pleural/parenchymal scarring. Other: None IMPRESSION: CT head: 1.  No acute intracranial abnormality. 2. 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 CT cervical spine: 1. Unchanged appearance of the type 3 odontoid process fracture. No new fracture. 2. 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 No significant soft tissue abnormality or significant interval change. Electronically Signed   By: Larose Hires D.O.   On: 12/21/2021 17:32   CT Cervical Spine Wo Contrast  Result Date: 12/21/2021 CLINICAL DATA:  Neck trauma.  Fall. EXAM: CT HEAD WITHOUT CONTRAST CT CERVICAL SPINE WITHOUT CONTRAST TECHNIQUE: Multidetector CT imaging of the head and cervical spine was performed following the standard protocol without intravenous contrast. Multiplanar CT image reconstructions of the cervical spine were also generated. RADIATION DOSE REDUCTION: This exam was performed according to the departmental dose-optimization program  which includes automated exposure control, adjustment of the mA and/or kV according to patient size and/or use of iterative reconstruction technique. COMPARISON:  CT examination dated September 30, 2021 FINDINGS: CT HEAD FINDINGS Brain: No evidence of acute infarction, hemorrhage, hydrocephalus, extra-axial collection or mass lesion/mass effect. Mega cisterna magna. Moderate cerebral volume loss and chronic microvascular ischemic changes of the white matter. Left occipital encephalomalacia suggesting prior infarct, unchanged. Vascular: No hyperdense vessel or unexpected calcification. Skull: Normal. Negative for fracture or focal lesion. Sinuses/Orbits: Patchy opacification of the ethmoid air cells. Other: None. CT CERVICAL SPINE FINDINGS Alignment: Unchanged anterolisthesis of dense secondary to the fracture. Reversal of normal cervical lordosis. Mild retrolisthesis of C5. Skull base and vertebrae: Unchanged appearance of the type 3 dens fracture with asymmetric extension into the lateral mass of C2 with mild anterior subluxation. Soft tissues and spinal canal: No prevertebral fluid or swelling. No visible canal hematoma. Disc levels: Advanced degenerative disc disease most prominent at C4-C5, C5-C6, unchanged. Moderate spinal canal and neural foraminal stenosis bilaterally at these levels. Upper chest: Emphysematous changes with pleural/parenchymal scarring. Other: None IMPRESSION: CT head: 1.  No acute intracranial abnormality. 2. 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 CT cervical spine: 1. Unchanged appearance of the type 3 odontoid process fracture. No new fracture. 2. 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 No significant soft tissue abnormality or significant interval change. Electronically Signed   By: Larose Hires D.O.   On: 12/21/2021 17:32        Scheduled  Meds:  [START ON 12/24/2021] amLODipine  5 mg Oral Daily   azithromycin  500 mg Oral Q24H   clonazepam  1.25 mg Oral QHS   enoxaparin (LOVENOX) injection  40 mg Subcutaneous Q24H   insulin aspart  0-6 Units Subcutaneous Q4H   [START ON 12/24/2021] losartan  100 mg Oral Daily   memantine  20 mg Oral QHS   mometasone-formoterol  2 puff Inhalation BID   naproxen  500 mg Oral BID   pantoprazole  40 mg Oral Q24H   PARoxetine  60 mg Oral Daily   pramipexole  1 mg Oral QHS   prednisoLONE acetate  1 drop Left Eye BID   pregabalin  100 mg Oral BID   Ensure Max Protein   Oral Daily   rivastigmine  6 mg Oral BID WC   sodium chloride  1 drop Right Eye TID   sodium chloride flush  3 mL Intravenous Q12H   tamsulosin  0.4 mg Oral Daily   traZODone  50 mg Oral QHS   umeclidinium bromide  1 puff Inhalation Daily   vitamin B-12  500 mcg Oral Daily   Continuous Infusions:  cefTRIAXone (ROCEPHIN)  IV 2 g (12/22/21 1712)   lactated ringers 100 mL/hr at 12/23/21 0012     LOS: 2 days  Time spent: 35 minutes    Irine Seal, MD [facility]   To contact the attending provider between 7A-7P or the covering provider during after hours 7P-7A, please log into the web site [email or web address] and access using universal [place] password for that web site. If you do not have the password, please call the hospital operator.  12/23/2021, 12:33 PM

## 2021-12-23 NOTE — Progress Notes (Signed)
Pt affiliated with [facility]. PCP : Howell Pringle, Mallie Darting SW [phone]. [phone].  Information provided by Luvenia Redden transfer coordinator, April. Gae Gallop RN,BSN,CM [phone]

## 2021-12-23 NOTE — Progress Notes (Signed)
Orthopedic Tech Progress Note Patient Details:  Benjamin Boyle [birth year] [id number]  Called in order to [employer] for a ASPEN COLLAR  Patient ID: Benjamin Boyle, male   DOB: 12/10/1945, 76 y.o.   MRN: [id number]  Benjamin Boyle 12/23/2021, 6:48 PM

## 2021-12-24 LAB — COMPREHENSIVE METABOLIC PANEL
ALT: 69 U/L — ABNORMAL HIGH (ref 0–44)
AST: 66 U/L — ABNORMAL HIGH (ref 15–41)
Albumin: 1.7 g/dL — ABNORMAL LOW (ref 3.5–5.0)
Alkaline Phosphatase: 49 U/L (ref 38–126)
Anion gap: 8 (ref 5–15)
BUN: 21 mg/dL (ref 8–23)
CO2: 20 mmol/L — ABNORMAL LOW (ref 22–32)
Calcium: 8.1 mg/dL — ABNORMAL LOW (ref 8.9–10.3)
Chloride: 111 mmol/L (ref 98–111)
Creatinine, Ser: 0.89 mg/dL (ref 0.61–1.24)
GFR, Estimated: >60 mL/min (ref >60)
Glucose, Bld: 106 mg/dL — ABNORMAL HIGH (ref 70–99)
Potassium: 4.1 mmol/L (ref 3.5–5.1)
Sodium: 139 mmol/L (ref 135–145)
Total Bilirubin: 0.4 mg/dL (ref 0.3–1.2)
Total Protein: 4.4 g/dL — ABNORMAL LOW (ref 6.5–8.1)

## 2021-12-24 LAB — CBC
HCT: 40 % (ref 39.0–52.0)
Hemoglobin: 12.8 g/dL — ABNORMAL LOW (ref 13.0–17.0)
MCH: 30 pg (ref 26.0–34.0)
MCHC: 32 g/dL (ref 30.0–36.0)
MCV: 93.9 fL (ref 80.0–100.0)
Platelets: 170 10*3/uL (ref 150–400)
RBC: 4.26 MIL/uL (ref 4.22–5.81)
RDW: 14.4 % (ref 11.5–15.5)
WBC: 5.7 10*3/uL (ref 4.0–10.5)
nRBC: 0 % (ref 0.0–0.2)

## 2021-12-24 LAB — GLUCOSE, CAPILLARY
Glucose-Capillary: 101 mg/dL — ABNORMAL HIGH (ref 70–99)
Glucose-Capillary: 143 mg/dL — ABNORMAL HIGH (ref 70–99)
Glucose-Capillary: 110 mg/dL — ABNORMAL HIGH (ref 70–99)
Glucose-Capillary: 118 mg/dL — ABNORMAL HIGH (ref 70–99)
Glucose-Capillary: 131 mg/dL — ABNORMAL HIGH (ref 70–99)
Glucose-Capillary: 131 mg/dL — ABNORMAL HIGH (ref 70–99)

## 2021-12-24 NOTE — Progress Notes (Signed)
PROGRESS NOTE    Benjamin Boyle  [id number] DOB: [birth year] DOA: 12/21/2021 PCP: [facility]    Chief Complaint  Benjamin Boyle presents with   Fall    Brief Narrative:  Benjamin Boyle is a pleasant 76 y.o. male with past medical history of dementia, type 2 diabetes, hypertension  presented to the hospital after he was found down by EMS. The Benjamin Boyle was found under his bed and may have been there for over 24 hours.   In the ED, Benjamin Boyle was febrile with pulse ox of 90 on 2 L of supplemental oxygen.  He was noted to be tachycardic, tachypneic but had stable blood pressure.  EKG showed normal sinus rhythm.  CT head scan and C-spine was negative.  CT chest showed dense airspace consolidation in the right middle lobe and patchy Benjamin Boyle in the right lower lobe suggestive of multifocal pneumonia.  Benjamin Boyle was then admitted hospital for further evaluation and treatment.   Assessment & Plan:   Principal Problem:   Multifocal pneumonia Active Problems:   Acute respiratory failure with hypoxia (HCC)   CAD (coronary artery disease)   Hypertension   Hypokalemia   Elevated transaminase level   Diabetes (HCC)   Duodenitis   Dementia (HCC)   Closed type III fracture of odontoid process, sequela   Sepsis ([place])   Fall at home, initial encounter   Debility  #1 sepsis secondary to multifocal pneumonia, POA -Benjamin Boyle presented noted to be febrile, tachycardic, hypoxic found to be down on the floor. -CT chest done concerning for multifocal pneumonia. -Urine strep pneumococcus antigen negative, urine Legionella antigen negative. -Blood cultures with no growth to date x2 days. -Sputum Gram stain and cultures pending. -Influenza A/B PCR negative, SARS coronavirus 2 PCR negative. -Procalcitonin noted elevated at 2.09 trended down currently at 1.26. -Hypoxia improving currently with sats of 93% on room air -Continue IV Rocephin, oral azithromycin.   -Could likely transition from IV Rocephin  to oral [place] tomorrow.   -Continue Incruse, Dulera.  Albuterol as needed.   -Supportive care.    2.  Acute hypoxic respiratory failure secondary to pneumonia -See problem #1. -Hypoxia improved with sats of 93% on room air. -Continue empiric IV antibiotics. -Continue Dulera, Spiriva, albuterol as needed.  3.  Fall/Debility -Unknown etiology -No acute trauma noted. -CT head CT C-spine with no acute abnormalities. -Benjamin Boyle seen by PT who are recommending SNF however family prefers to take Benjamin Boyle home per PT note. -If Benjamin Boyle indeed goes home will need home health which has been ordered.  4.  Hypokalemia -Repleted.  Potassium at 4.1 -Repeat labs in the AM.  5.  Diabetes mellitus type 2 -Last hemoglobin A1c 6.9% 07/2021. -CBG 143 this morning -SSI.  6.  History of odontoid process fracture -Benjamin Boyle noted to have suffered a type III odontoid process fracture in April followed by neurosurgery. -CT C-spine and head with stable findings noted. -Cervical collar to be changed to Aspen collar.    7.  Dementia -Supportive care.  8.  Transaminitis -Benjamin Boyle noted with elevated LFTs felt secondary to mild rhabdomyolysis. -CT abdomen and pelvis with no acute findings. -LFTs trending down with hydration. -Continue IV fluids.  9.  Mild rhabdomyolysis -Felt likely secondary to fall and found on the floor. -CK levels trending down at 529 from 1831. -Repeat CK levels in the a.m. -Continue IV fluids  10.  CKD stage II -Renal function stable. -IV fluids.  11.  Duodenitis -Duodenal wall thickening and surrounding edema noted in  CT. -Abdominal exam benign. -Continue PPI.      DVT prophylaxis: Lovenox Code Status: Full Family Communication: Updated Benjamin Boyle.  No family at bedside. Disposition: Home health PT versus SNF  Status is: Inpatient Remains inpatient appropriate because: Severity of illness   Consultants:  None  Procedures:  CT head CT C-spine 12/21/2021 CT chest  abdomen and pelvis 12/21/2021 CT L-spine 12/21/2021  Antimicrobials:  Oral azithromycin 12/23/2021>>>> 12/26/2021 IV azithromycin 12/21/2021>>>> 12/23/2021 IV Rocephin 12/21/2021>>>>>>   Subjective: Laying in bed in the fetal position.  Cervical collar off.  No chest pain.  No shortness of breath.  No abdominal pain.  Pleasantly confused.    Objective: Vitals:   12/23/21 2038 12/24/21 0325 12/24/21 0839 12/24/21 0859  BP: 129/77 (!) 146/91  (!) 151/95  Pulse: 63 64 71   Resp: 20 20 (!) 23 18  Temp: 97.9 F (36.6 C) 97.8 F (36.6 C)  97.9 F (36.6 C)  TempSrc:    Axillary  SpO2: 95% 91% 92% 92%  Weight:      Height:        Intake/Output Summary (Last 24 hours) at 12/24/2021 1329 Last data filed at 12/24/2021 0329 Gross per 24 hour  Intake --  Output 700 ml  Net -700 ml    Filed Weights   12/22/21 1612  Weight: 62 kg    Examination:  General exam: Appears calm and comfortable.  Cervical collar off.  Respiratory system: Clear to auscultation bilaterally.  No wheezes, no crackles, no rhonchi.  Fair air movement.  Speaking in full sentences.   Cardiovascular system: RRR no murmurs rubs or gallops.  No JVD.  No lower extremity edema.   Gastrointestinal system: Abdomen is nondistended, soft and nontender. No organomegaly or masses felt. Normal bowel sounds heard. Central nervous system: Alert. No focal neurological deficits. Extremities: Symmetric 5 x 5 power. Skin: No rashes, lesions or ulcers Psychiatry: Judgement and insight appear poor to fair. Mood & affect appropriate.     Data Reviewed: I have personally reviewed following labs and imaging studies  CBC: Recent Labs  Lab 12/21/21 1600 12/21/21 1607 12/22/21 0347 12/23/21 0156 12/24/21 0224  WBC 7.4  --  6.7 5.2 5.7  NEUTROABS 6.4  --   --   --   --   HGB 12.7* 11.9* 12.2* 10.8* 12.8*  HCT 38.2* 35.0* 35.1* 31.1* 40.0  MCV 91.2  --  89.1 89.1 93.9  PLT 173  --  160 153 170     Basic Metabolic Panel: Recent Labs   Lab 12/21/21 1600 12/21/21 1607 12/21/21 2154 12/22/21 0347 12/23/21 0156 12/24/21 0224  NA 135 135  --  137 135 139  K 2.9* 2.9*  --  3.2* 3.6 4.1  CL 101 101  --  106 107 111  CO2 22  --   --  22 20* 20*  GLUCOSE 130* 126*  --  105* 130* 106*  BUN 23 24*  --  23 24* 21  CREATININE 1.14 1.00  --  1.12 0.99 0.89  CALCIUM 8.7*  --   --  8.2* 7.9* 8.1*  MG  --   --  2.1 2.1  --   --      GFR: Estimated Creatinine Clearance: 63.9 mL/min (by C-G formula based on SCr of 0.89 mg/dL).  Liver Function Tests: Recent Labs  Lab 12/21/21 1600 12/22/21 0347 12/23/21 0156 12/24/21 0224  AST 149* 142* 103* 66*  ALT 95* 96* 86* 69*  ALKPHOS 50 43 41  49  BILITOT 0.9 0.5 0.5 0.4  PROT 5.6* 4.9* 4.2* 4.4*  ALBUMIN 2.5* 2.1* 1.7* 1.7*     CBG: Recent Labs  Lab 12/23/21 2035 12/23/21 2312 12/24/21 0323 12/24/21 0901 12/24/21 1143  GLUCAP 162* 139* 101* 143* 110*      Recent Results (from the past 240 hour(s))  Resp Panel by RT-PCR (Flu A&B, Covid) Anterior Nasal Swab     Status: None   Collection Time: 12/21/21  6:01 PM   Specimen: Anterior Nasal Swab  Result Value Ref Range Status   SARS Coronavirus 2 by RT PCR NEGATIVE NEGATIVE Final    Comment: (NOTE) SARS-CoV-2 target nucleic acids are NOT DETECTED.  The SARS-CoV-2 RNA is generally detectable in upper respiratory specimens during the acute phase of infection. The lowest concentration of SARS-CoV-2 viral copies this assay can detect is 138 copies/mL. A negative result does not preclude SARS-Cov-2 infection and should not be used as the sole basis for treatment or other Benjamin Boyle management decisions. A negative result may occur with  improper specimen collection/handling, submission of specimen other than nasopharyngeal swab, presence of viral mutation(s) within the areas targeted by this assay, and inadequate number of viral copies(<138 copies/mL). A negative result must be combined with clinical observations,  Benjamin Boyle history, and epidemiological information. The expected result is Negative.  Fact Sheet for Patients:  BloggerCourse.com  Fact Sheet for Healthcare Providers:  SeriousBroker.it  This test is no t yet approved or cleared by the [place] FDA and  has been authorized for detection and/or diagnosis of SARS-CoV-2 by FDA under an Emergency Use Authorization (EUA). This EUA will remain  in effect (meaning this test can be used) for the duration of the COVID-19 declaration under Section 564(b)(1) of the Act, 21 U.S.C.section 360bbb-3(b)(1), unless the authorization is terminated  or revoked sooner.       Influenza A by PCR NEGATIVE NEGATIVE Final   Influenza B by PCR NEGATIVE NEGATIVE Final    Comment: (NOTE) The Xpert Xpress SARS-CoV-2/FLU/RSV plus assay is intended as an aid in the diagnosis of influenza from Nasopharyngeal swab specimens and should not be used as a sole basis for treatment. Nasal washings and aspirates are unacceptable for Xpert Xpress SARS-CoV-2/FLU/RSV testing.  Fact Sheet for Patients: BloggerCourse.com  Fact Sheet for Healthcare Providers: SeriousBroker.it  This test is not yet approved or cleared by the [place] FDA and has been authorized for detection and/or diagnosis of SARS-CoV-2 by FDA under an Emergency Use Authorization (EUA). This EUA will remain in effect (meaning this test can be used) for the duration of the COVID-19 declaration under Section 564(b)(1) of the Act, 21 U.S.C. section 360bbb-3(b)(1), unless the authorization is terminated or revoked.  Performed at [facility], [address], [place], [place] [address]   Blood culture (routine x 2)     Status: None (Preliminary result)   Collection Time: 12/21/21  6:17 PM   Specimen: BLOOD  Result Value Ref Range Status   Specimen Description BLOOD SITE NOT SPECIFIED  Final    Special Requests   Final    BOTTLES DRAWN AEROBIC AND ANAEROBIC Blood Culture adequate volume   Culture   Final    NO GROWTH 3 DAYS Performed at [facility], [address] [place], [place] [address]    Report Status PENDING  Incomplete  Blood culture (routine x 2)     Status: None (Preliminary result)   Collection Time: 12/21/21  6:18 PM   Specimen:  BLOOD  Result Value Ref Range Status   Specimen Description BLOOD SITE NOT SPECIFIED  Final   Special Requests   Final    BOTTLES DRAWN AEROBIC AND ANAEROBIC Blood Culture adequate volume   Culture   Final    NO GROWTH 3 DAYS Performed at [facility], [address] [place], [place] [address]    Report Status PENDING  Incomplete         Radiology Studies: No results found.      Scheduled Meds:  amLODipine  5 mg Oral Daily   azithromycin  500 mg Oral Q24H   clonazepam  1.25 mg Oral QHS   enoxaparin (LOVENOX) injection  40 mg Subcutaneous Q24H   insulin aspart  0-6 Units Subcutaneous Q4H   losartan  100 mg Oral Daily   memantine  20 mg Oral QHS   mometasone-formoterol  2 puff Inhalation BID   naproxen  500 mg Oral BID   pantoprazole  40 mg Oral Q24H   PARoxetine  60 mg Oral Daily   pramipexole  1 mg Oral QHS   prednisoLONE acetate  1 drop Left Eye BID   pregabalin  100 mg Oral BID   Ensure Max Protein   Oral Daily   rivastigmine  6 mg Oral BID WC   sodium chloride  1 drop Right Eye TID   sodium chloride flush  3 mL Intravenous Q12H   tamsulosin  0.4 mg Oral Daily   traZODone  50 mg Oral QHS   umeclidinium bromide  1 puff Inhalation Daily   vitamin B-12  500 mcg Oral Daily   Continuous Infusions:  cefTRIAXone (ROCEPHIN)  IV 2 g (12/23/21 1722)   lactated ringers 100 mL/hr at 12/23/21 2029     LOS: 3 days    Time spent: 35 minutes    Irine Seal, MD [facility]   To contact the attending provider between 7A-7P or the covering provider during after hours 7P-7A, please log  into the web site [email or web address] and access using universal [place] password for that web site. If you do not have the password, please call the hospital operator.  12/24/2021, 1:29 PM

## 2021-12-25 LAB — COMPREHENSIVE METABOLIC PANEL
ALT: 61 U/L — ABNORMAL HIGH (ref 0–44)
AST: 55 U/L — ABNORMAL HIGH (ref 15–41)
Albumin: 1.8 g/dL — ABNORMAL LOW (ref 3.5–5.0)
Alkaline Phosphatase: 53 U/L (ref 38–126)
Anion gap: 11 (ref 5–15)
BUN: 16 mg/dL (ref 8–23)
CO2: 21 mmol/L — ABNORMAL LOW (ref 22–32)
Calcium: 8.3 mg/dL — ABNORMAL LOW (ref 8.9–10.3)
Chloride: 107 mmol/L (ref 98–111)
Creatinine, Ser: 0.8 mg/dL (ref 0.61–1.24)
GFR, Estimated: >60 mL/min (ref >60)
Glucose, Bld: 147 mg/dL — ABNORMAL HIGH (ref 70–99)
Potassium: 3.1 mmol/L — ABNORMAL LOW (ref 3.5–5.1)
Sodium: 139 mmol/L (ref 135–145)
Total Bilirubin: 0.7 mg/dL (ref 0.3–1.2)
Total Protein: 4.4 g/dL — ABNORMAL LOW (ref 6.5–8.1)

## 2021-12-25 LAB — GLUCOSE, CAPILLARY
Glucose-Capillary: 122 mg/dL — ABNORMAL HIGH (ref 70–99)
Glucose-Capillary: 103 mg/dL — ABNORMAL HIGH (ref 70–99)
Glucose-Capillary: 127 mg/dL — ABNORMAL HIGH (ref 70–99)
Glucose-Capillary: 133 mg/dL — ABNORMAL HIGH (ref 70–99)
Glucose-Capillary: 147 mg/dL — ABNORMAL HIGH (ref 70–99)

## 2021-12-25 LAB — CBC
HCT: 33.6 % — ABNORMAL LOW (ref 39.0–52.0)
Hemoglobin: 11.3 g/dL — ABNORMAL LOW (ref 13.0–17.0)
MCH: 30.3 pg (ref 26.0–34.0)
MCHC: 33.6 g/dL (ref 30.0–36.0)
MCV: 90.1 fL (ref 80.0–100.0)
Platelets: 206 10*3/uL (ref 150–400)
RBC: 3.73 MIL/uL — ABNORMAL LOW (ref 4.22–5.81)
RDW: 14.4 % (ref 11.5–15.5)
WBC: 5.6 10*3/uL (ref 4.0–10.5)
nRBC: 0 % (ref 0.0–0.2)

## 2021-12-25 LAB — MAGNESIUM: Magnesium: 1.7 mg/dL (ref 1.7–2.4)

## 2021-12-25 LAB — CK: Total CK: 228 U/L (ref 49–397)

## 2021-12-25 MED ORDER — MAGNESIUM SULFATE 2 GM/50ML IV SOLN
2.0000 g | Freq: Once | INTRAVENOUS | Status: AC
  Administered 2021-12-25: 2 g via INTRAVENOUS
  Filled 2021-12-25: qty 50

## 2021-12-25 MED ORDER — POTASSIUM CHLORIDE CRYS ER 20 MEQ PO TBCR
40.0000 meq | EXTENDED_RELEASE_TABLET | ORAL | Status: AC
  Administered 2021-12-25 (×2): 40 meq via ORAL
  Filled 2021-12-25 (×2): qty 2

## 2021-12-25 MED ORDER — CEFDINIR 300 MG PO CAPS
300.0000 mg | ORAL_CAPSULE | Freq: Two times a day (BID) | ORAL | Status: AC
  Administered 2021-12-25 – 2021-12-27 (×6): 300 mg via ORAL
  Filled 2021-12-25 (×6): qty 1

## 2021-12-25 NOTE — Progress Notes (Signed)
Bladder scan showed . Intermittent cath produced .

## 2021-12-25 NOTE — Progress Notes (Signed)
Physical Therapy Treatment Patient Details Name: Benjamin Boyle MRN: [id number] DOB: Mar 27, 1946 Today's Date: 12/25/2021   History of Present Illness Pt is a pleasant 76 y.o. male with medical history significant for dementia, type 2 diabetes mellitus, hypertension, and odontoid fracture, now presenting to the emergency department after he was found down.  History is very limited due to the patient's clinical condition and inability to reach family. EMS reported that patient was found under his bed and they thought he may have been down for 24 hours. He was given magnesium and calcium prior to arrival in the hospital due to something that this on the cardiac monitor, again details very limited unfortunately. Pt admitted with sepsis due to pneumonia.    PT Comments    The patient continues to require significant assistance with bed mobility and transfers. Unable to safely progress ambulation with one person assistance due to poor standing tolerance and fatigue with minimal activity. Standing balance is also poor with posterior lean. If patient is going home, he will likely need significant assistance from caregivers with any mobility efforts. PT will continue to follow to maximize independence and decrease caregiver burden.    Recommendations for follow up therapy are one component of a multi-disciplinary discharge planning process, led by the attending physician.  Recommendations may be updated based on patient status, additional functional criteria and insurance authorization.  Follow Up Recommendations  Skilled nursing-short term rehab (<3 hours/day) (HHPT recommended if family takes patient home) Can patient physically be transported by private vehicle: No   Assistance Recommended at Discharge Frequent or constant Supervision/Assistance  Patient can return home with the following Two people to help with walking and/or transfers;A lot of help with bathing/dressing/bathroom;Help with stairs or  ramp for entrance;Assist for transportation;Direct supervision/assist for medications management;Assistance with cooking/housework   Equipment Recommendations  None recommended by PT    Recommendations for Other Services       Precautions / Restrictions Precautions Precautions: Fall;Cervical Required Braces or Orthoses: Cervical Brace Cervical Brace:  Actuary. apply in sitting) Restrictions Weight Bearing Restrictions: No     Mobility  Bed Mobility Overal bed mobility: Needs Assistance Bed Mobility: Supine to Sit, Sit to Supine     Supine to sit: Max assist Sit to supine: Max assist   General bed mobility comments: assistance for trunk and BLE support. verbal cues for technique. increased time and effort required. Aspen Collar donned while seated per order    Transfers Overall transfer level: Needs assistance Equipment used: Rolling walker (2 wheels) Transfers: Sit to/from Stand Sit to Stand: Max assist   Step pivot transfers: Max assist       General transfer comment: lifting and lowering assistance provided. faciliation for hip extension and right foot blocked to prevent knee extension as patient with posterior bias with lift off.    Ambulation/Gait               General Gait Details: not assessed due to poor standing balance and fatigue with minimal activity. recommend a second person for ambulation efforts   Stairs             Wheelchair Mobility    Modified Rankin (Stroke Patients Only)       Balance Overall balance assessment: Needs assistance Sitting-balance support: Bilateral upper extremity supported Sitting balance-Leahy Scale: Poor   Postural control: Posterior lean Standing balance support: Bilateral upper extremity supported, Reliant on assistive device for balance Standing balance-Leahy Scale: Zero Standing balance comment: patient relying heavily  on bed for posterior leg support. facilitation for anterior weight shifting  with posterior lean more pronounced with increased standing time. standing tolerance less than 30 seconds                            Cognition Arousal/Alertness: Awake/alert Behavior During Therapy: Flat affect Overall Cognitive Status: History of cognitive impairments - at baseline                                 General Comments: decreased short term memory. follows single step commands with dealy. decreased awareness of physical limitation and need for assistance.        Exercises      General Comments        Pertinent Vitals/Pain Pain Assessment Pain Assessment: No/denies pain    Home Living                          Prior Function            PT Goals (current goals can now be found in the care plan section) Acute Rehab PT Goals Patient Stated Goal: to go home PT Goal Formulation: With patient Time For Goal Achievement: 01/02/22 Potential to Achieve Goals: Fair Progress towards PT goals: Progressing toward goals    Frequency    Min 3X/week      PT Plan Current plan remains appropriate    Co-evaluation              AM-PAC PT "6 Clicks" Mobility   Outcome Measure  Help needed turning from your back to your side while in a flat bed without using bedrails?: A Lot Help needed moving from lying on your back to sitting on the side of a flat bed without using bedrails?: A Lot Help needed moving to and from a bed to a chair (including a wheelchair)?: A Lot Help needed standing up from a chair using your arms (e.g., wheelchair or bedside chair)?: A Lot Help needed to walk in hospital room?: Total Help needed climbing 3-5 steps with a railing? : Total 6 Click Score: 10    End of Session   Activity Tolerance: Patient limited by fatigue Patient left: in bed;with call bell/phone within reach;with bed alarm set [facility] removed per patient request once back to bed) Nurse Communication: Mobility status PT Visit  Diagnosis: Other abnormalities of gait and mobility (R26.89);History of falling (Z91.81);Muscle weakness (generalized) (M62.81)     Time: 7035-0093 PT Time Calculation (min) (ACUTE ONLY): 23 min  Charges:  $Therapeutic Activity: 23-37 mins                     Donna Bernard, PT, MPT    Ina Homes 12/25/2021, 3:17 PM

## 2021-12-25 NOTE — Progress Notes (Signed)
PROGRESS NOTE    Benjamin Boyle  [id number] DOB: 1946/05/04 DOA: 12/21/2021 PCP: [facility]    Chief Complaint  Patient presents with   Fall    Brief Narrative:  Benjamin Boyle is a pleasant 76 y.o. male with past medical history of dementia, type 2 diabetes, hypertension  presented to the hospital after he was found down by EMS. The patient was found under his bed and may have been there for over 24 hours.   In the ED, patient was febrile with pulse ox of 90 on 2 L of supplemental oxygen.  He was noted to be tachycardic, tachypneic but had stable blood pressure.  EKG showed normal sinus rhythm.  CT head scan and C-spine was negative.  CT chest showed dense airspace consolidation in the right middle lobe and patchy patient in the right lower lobe suggestive of multifocal pneumonia.  Patient was then admitted hospital for further evaluation and treatment.   Assessment & Plan:   Principal Problem:   Multifocal pneumonia Active Problems:   Acute respiratory failure with hypoxia (HCC)   CAD (coronary artery disease)   Hypertension   Hypokalemia   Elevated transaminase level   Diabetes (HCC)   Duodenitis   Dementia (HCC)   Closed type III fracture of odontoid process, sequela   Sepsis ([place])   Fall at home, initial encounter   Debility  #1 sepsis secondary to multifocal pneumonia, POA -Patient presented noted to be febrile, tachycardic, hypoxic found to be down on the floor. -CT chest done concerning for multifocal pneumonia. -Urine strep pneumococcus antigen negative, urine Legionella antigen negative. -Blood cultures with no growth to date x4 days. -Sputum Gram stain and cultures pending. -Influenza A/B PCR negative, SARS coronavirus 2 PCR negative. -Procalcitonin noted elevated at 2.09 trended down currently at 1.26. -Hypoxia improving currently with sats of 94% on room air -Status post 5 days azithromycin.   -Transition from IV Rocephin to oral Omnicef to  complete a 7-day course of antibiotic treatment.  -Continue Incruse, Dulera.  Albuterol as needed.   -Supportive care.    2.  Acute hypoxic respiratory failure secondary to pneumonia -See problem #1. -Hypoxia improved with sats of 94% on room air. -Continue empiric IV antibiotics. -Continue Dulera, Spiriva, albuterol as needed.  3.  Fall/Debility -Unknown etiology -No acute trauma noted. -CT head CT C-spine with no acute abnormalities. -Patient seen by PT who are recommending SNF however family prefers to take patient home per PT note. -If patient indeed goes home will need home health which has been ordered.  4.  Hypokalemia -Potassium at 3.1.   -Magnesium at 1.7.   -Replete.    5.  Diabetes mellitus type 2 -Last hemoglobin A1c 6.9% 07/2021. -CBG 103 this morning -SSI.  6.  History of odontoid process fracture -Patient noted to have suffered a type III odontoid process fracture in April followed by neurosurgery. -CT C-spine and head with stable findings noted. -Cervical collar changed to Aspen collar.    7.  Dementia -Supportive care.  8.  Transaminitis -Patient noted with elevated LFTs felt secondary to mild rhabdomyolysis. -CT abdomen and pelvis with no acute findings. -LFTs trending down with hydration. -Continue IV fluids.  9.  Mild rhabdomyolysis -Felt likely secondary to fall and found on the floor. -CK levels trending down at 228 from 529 from 1831. -Continue gentle hydration.  10.  CKD stage II -Renal function stable. -Continue gentle hydration with IV fluids.  11.  Duodenitis -Duodenal wall thickening  and surrounding edema noted in CT. -Abdominal exam benign. -Continue PPI. -Outpatient follow-up.      DVT prophylaxis: Lovenox Code Status: Full Family Communication: Updated patient.  No family at bedside. Disposition: Home health PT versus SNF  Status is: Inpatient Remains inpatient appropriate because: Severity of illness   Consultants:   None  Procedures:  CT head CT C-spine 12/21/2021 CT chest abdomen and pelvis 12/21/2021 CT L-spine 12/21/2021  Antimicrobials:  Oral azithromycin 12/23/2021>>>> 12/26/2021 IV azithromycin 12/21/2021>>>> 12/23/2021 IV Rocephin 12/21/2021>>>>>> 12/25/2021 Omnicef 12/25/2021>>>>   Subjective: Laying in bed.  Alert.  No chest pain.  No shortness of breath.  No abdominal pain.  Asking for television to be changed to the news.  Objective: Vitals:   12/24/21 2053 12/25/21 0414 12/25/21 0812 12/25/21 0837  BP:  140/75 127/73   Pulse:  65 63   Resp:  17 18   Temp:  98.2 F (36.8 C) 98.8 F (37.1 C)   TempSrc:   Oral   SpO2: 90% 100% 92% 94%  Weight:      Height:        Intake/Output Summary (Last 24 hours) at 12/25/2021 1307 Last data filed at 12/25/2021 1245 Gross per 24 hour  Intake 600 ml  Output 1450 ml  Net -850 ml    Filed Weights   12/22/21 1612  Weight: 62 kg    Examination:  General exam: Appears calm and comfortable.  Cervical collar off.  Respiratory system: CTA B.  No wheezes, no crackles, no rhonchi.  Fair air movement.  Speaking in full sentences. Cardiovascular system: Regular rate rhythm no murmurs rubs or gallops.  No JVD.  No lower extremity edema.  Gastrointestinal system: Abdomen is soft, nontender, nondistended, positive bowel sounds.  No rebound.  No guarding.   Central nervous system: Alert. No focal neurological deficits. Extremities: Symmetric 5 x 5 power. Skin: No rashes, lesions or ulcers Psychiatry: Judgement and insight appear poor to fair. Mood & affect appropriate.     Data Reviewed: I have personally reviewed following labs and imaging studies  CBC: Recent Labs  Lab 12/21/21 1600 12/21/21 1607 12/22/21 0347 12/23/21 0156 12/24/21 0224 12/25/21 0138  WBC 7.4  --  6.7 5.2 5.7 5.6  NEUTROABS 6.4  --   --   --   --   --   HGB 12.7* 11.9* 12.2* 10.8* 12.8* 11.3*  HCT 38.2* 35.0* 35.1* 31.1* 40.0 33.6*  MCV 91.2  --  89.1 89.1 93.9 90.1  PLT 173   --  160 153 170 206     Basic Metabolic Panel: Recent Labs  Lab 12/21/21 1600 12/21/21 1607 12/21/21 2154 12/22/21 0347 12/23/21 0156 12/24/21 0224 12/25/21 0138  NA 135 135  --  137 135 139 139  K 2.9* 2.9*  --  3.2* 3.6 4.1 3.1*  CL 101 101  --  106 107 111 107  CO2 22  --   --  22 20* 20* 21*  GLUCOSE 130* 126*  --  105* 130* 106* 147*  BUN 23 24*  --  23 24* 21 16  CREATININE 1.14 1.00  --  1.12 0.99 0.89 0.80  CALCIUM 8.7*  --   --  8.2* 7.9* 8.1* 8.3*  MG  --   --  2.1 2.1  --   --  1.7     GFR: Estimated Creatinine Clearance: 71 mL/min (by C-G formula based on SCr of 0.8 mg/dL).  Liver Function Tests: Recent Labs  Lab 12/21/21 1600  12/22/21 0347 12/23/21 0156 12/24/21 0224 12/25/21 0138  AST 149* 142* 103* 66* 55*  ALT 95* 96* 86* 69* 61*  ALKPHOS 50 43 41 49 53  BILITOT 0.9 0.5 0.5 0.4 0.7  PROT 5.6* 4.9* 4.2* 4.4* 4.4*  ALBUMIN 2.5* 2.1* 1.7* 1.7* 1.8*     CBG: Recent Labs  Lab 12/24/21 2019 12/24/21 2312 12/25/21 0414 12/25/21 0816 12/25/21 1215  GLUCAP 131* 131* 122* 103* 127*      Recent Results (from the past 240 hour(s))  Resp Panel by RT-PCR (Flu A&B, Covid) Anterior Nasal Swab     Status: None   Collection Time: 12/21/21  6:01 PM   Specimen: Anterior Nasal Swab  Result Value Ref Range Status   SARS Coronavirus 2 by RT PCR NEGATIVE NEGATIVE Final    Comment: (NOTE) SARS-CoV-2 target nucleic acids are NOT DETECTED.  The SARS-CoV-2 RNA is generally detectable in upper respiratory specimens during the acute phase of infection. The lowest concentration of SARS-CoV-2 viral copies this assay can detect is 138 copies/mL. A negative result does not preclude SARS-Cov-2 infection and should not be used as the sole basis for treatment or other patient management decisions. A negative result may occur with  improper specimen collection/handling, submission of specimen other than nasopharyngeal swab, presence of viral mutation(s) within  the areas targeted by this assay, and inadequate number of viral copies(<138 copies/mL). A negative result must be combined with clinical observations, patient history, and epidemiological information. The expected result is Negative.  Fact Sheet for Patients:  BloggerCourse.com  Fact Sheet for Healthcare Providers:  SeriousBroker.it  This test is no t yet approved or cleared by the [place] FDA and  has been authorized for detection and/or diagnosis of SARS-CoV-2 by FDA under an Emergency Use Authorization (EUA). This EUA will remain  in effect (meaning this test can be used) for the duration of the COVID-19 declaration under Section 564(b)(1) of the Act, 21 U.S.C.section 360bbb-3(b)(1), unless the authorization is terminated  or revoked sooner.       Influenza A by PCR NEGATIVE NEGATIVE Final   Influenza B by PCR NEGATIVE NEGATIVE Final    Comment: (NOTE) The Xpert Xpress SARS-CoV-2/FLU/RSV plus assay is intended as an aid in the diagnosis of influenza from Nasopharyngeal swab specimens and should not be used as a sole basis for treatment. Nasal washings and aspirates are unacceptable for Xpert Xpress SARS-CoV-2/FLU/RSV testing.  Fact Sheet for Patients: BloggerCourse.com  Fact Sheet for Healthcare Providers: SeriousBroker.it  This test is not yet approved or cleared by the [place] FDA and has been authorized for detection and/or diagnosis of SARS-CoV-2 by FDA under an Emergency Use Authorization (EUA). This EUA will remain in effect (meaning this test can be used) for the duration of the COVID-19 declaration under Section 564(b)(1) of the Act, 21 U.S.C. section 360bbb-3(b)(1), unless the authorization is terminated or revoked.  Performed at [facility], [address], [place], [place] [address]   Blood culture (routine x 2)     Status: None  (Preliminary result)   Collection Time: 12/21/21  6:17 PM   Specimen: BLOOD  Result Value Ref Range Status   Specimen Description BLOOD SITE NOT SPECIFIED  Final   Special Requests   Final    BOTTLES DRAWN AEROBIC AND ANAEROBIC Blood Culture adequate volume   Culture   Final    NO GROWTH 4 DAYS Performed at [facility], [address] [place], [place] [address]  Report Status PENDING  Incomplete  Blood culture (routine x 2)     Status: None (Preliminary result)   Collection Time: 12/21/21  6:18 PM   Specimen: BLOOD  Result Value Ref Range Status   Specimen Description BLOOD SITE NOT SPECIFIED  Final   Special Requests   Final    BOTTLES DRAWN AEROBIC AND ANAEROBIC Blood Culture adequate volume   Culture   Final    NO GROWTH 4 DAYS Performed at [facility], [address] [place], [place] [address]    Report Status PENDING  Incomplete         Radiology Studies: No results found.      Scheduled Meds:  amLODipine  5 mg Oral Daily   azithromycin  500 mg Oral Q24H   cefdinir  300 mg Oral Q12H   clonazepam  1.25 mg Oral QHS   enoxaparin (LOVENOX) injection  40 mg Subcutaneous Q24H   insulin aspart  0-6 Units Subcutaneous Q4H   memantine  20 mg Oral QHS   mometasone-formoterol  2 puff Inhalation BID   naproxen  500 mg Oral BID   pantoprazole  40 mg Oral Q24H   PARoxetine  60 mg Oral Daily   potassium chloride  40 mEq Oral Q4H   pramipexole  1 mg Oral QHS   prednisoLONE acetate  1 drop Left Eye BID   pregabalin  100 mg Oral BID   Ensure Max Protein   Oral Daily   rivastigmine  6 mg Oral BID WC   sodium chloride  1 drop Right Eye TID   sodium chloride flush  3 mL Intravenous Q12H   tamsulosin  0.4 mg Oral Daily   traZODone  50 mg Oral QHS   umeclidinium bromide  1 puff Inhalation Daily   vitamin B-12  500 mcg Oral Daily   Continuous Infusions:  lactated ringers 75 mL/hr at 12/25/21 1043     LOS: 4 days    Time spent: 35  minutes    Irine Seal, MD [facility]   To contact the attending provider between 7A-7P or the covering provider during after hours 7P-7A, please log into the web site [email or web address] and access using universal [place] password for that web site. If you do not have the password, please call the hospital operator.  12/25/2021, 1:07 PM

## 2021-12-26 DIAGNOSIS — R338 Other retention of urine: Secondary | ICD-10-CM | POA: Clinically undetermined

## 2021-12-26 LAB — BASIC METABOLIC PANEL
Anion gap: 8 (ref 5–15)
BUN: 12 mg/dL (ref 8–23)
CO2: 22 mmol/L (ref 22–32)
Calcium: 8.2 mg/dL — ABNORMAL LOW (ref 8.9–10.3)
Chloride: 110 mmol/L (ref 98–111)
Creatinine, Ser: 0.79 mg/dL (ref 0.61–1.24)
GFR, Estimated: >60 mL/min (ref >60)
Glucose, Bld: 119 mg/dL — ABNORMAL HIGH (ref 70–99)
Potassium: 3.6 mmol/L (ref 3.5–5.1)
Sodium: 140 mmol/L (ref 135–145)

## 2021-12-26 LAB — CBC
HCT: 35 % — ABNORMAL LOW (ref 39.0–52.0)
Hemoglobin: 11.6 g/dL — ABNORMAL LOW (ref 13.0–17.0)
MCH: 30.6 pg (ref 26.0–34.0)
MCHC: 33.1 g/dL (ref 30.0–36.0)
MCV: 92.3 fL (ref 80.0–100.0)
Platelets: 238 10*3/uL (ref 150–400)
RBC: 3.79 MIL/uL — ABNORMAL LOW (ref 4.22–5.81)
RDW: 14.6 % (ref 11.5–15.5)
WBC: 5.7 10*3/uL (ref 4.0–10.5)
nRBC: 0 % (ref 0.0–0.2)

## 2021-12-26 LAB — CULTURE, BLOOD (ROUTINE X 2)
Culture: NO GROWTH
Culture: NO GROWTH
Special Requests: ADEQUATE
Special Requests: ADEQUATE

## 2021-12-26 LAB — GLUCOSE, CAPILLARY
Glucose-Capillary: 100 mg/dL — ABNORMAL HIGH (ref 70–99)
Glucose-Capillary: 117 mg/dL — ABNORMAL HIGH (ref 70–99)
Glucose-Capillary: 122 mg/dL — ABNORMAL HIGH (ref 70–99)
Glucose-Capillary: 125 mg/dL — ABNORMAL HIGH (ref 70–99)
Glucose-Capillary: 138 mg/dL — ABNORMAL HIGH (ref 70–99)
Glucose-Capillary: 275 mg/dL — ABNORMAL HIGH (ref 70–99)

## 2021-12-26 LAB — MAGNESIUM: Magnesium: 2 mg/dL (ref 1.7–2.4)

## 2021-12-26 MED ORDER — POTASSIUM CHLORIDE CRYS ER 10 MEQ PO TBCR
40.0000 meq | EXTENDED_RELEASE_TABLET | Freq: Once | ORAL | Status: AC
  Administered 2021-12-26: 40 meq via ORAL
  Filled 2021-12-26: qty 4

## 2021-12-26 NOTE — Progress Notes (Signed)
PROGRESS NOTE    Benjamin Boyle  FUX:[id number] DOB: [birth year] DOA: 12/21/2021 PCP: [facility]    Chief Complaint  Patient presents with   Fall    Brief Narrative:  Benjamin Boyle is a pleasant 76 y.o. male with past medical history of dementia, type 2 diabetes, hypertension  presented to the hospital after he was found down by EMS. The patient was found under his bed and may have been there for over 24 hours.   In the ED, patient was febrile with pulse ox of 90 on 2 L of supplemental oxygen.  He was noted to be tachycardic, tachypneic but had stable blood pressure.  EKG showed normal sinus rhythm.  CT head scan and C-spine was negative.  CT chest showed dense airspace consolidation in the right middle lobe and patchy patient in the right lower lobe suggestive of multifocal pneumonia.  Patient was then admitted hospital for further evaluation and treatment.   Assessment & Plan:   Principal Problem:   Multifocal pneumonia Active Problems:   Acute respiratory failure with hypoxia (HCC)   CAD (coronary artery disease)   Hypertension   Hypokalemia   Elevated transaminase level   Diabetes (HCC)   Duodenitis   Dementia (HCC)   Closed type III fracture of odontoid process, sequela   Sepsis (HCC)   Fall at home, initial encounter   Debility   Acute urinary retention  #1 sepsis secondary to multifocal pneumonia, POA -Patient presented noted to be febrile, tachycardic, hypoxic found to be down on the floor. -CT chest done concerning for multifocal pneumonia. -Urine strep pneumococcus antigen negative, urine Legionella antigen negative. -Blood cultures with no growth to date x4 days. -Sputum Gram stain and cultures pending. -Influenza A/B PCR negative, SARS coronavirus 2 PCR negative. -Procalcitonin noted elevated at 2.09 trended down currently at 1.26. -Hypoxia improving currently with sats of 95% on room air -Status post 5 days azithromycin.   -Transitioned from IV  Rocephin to oral Omnicef to complete a 7-day course of antibiotic treatment.  -Continue Incruse, Dulera.  Albuterol as needed.   -Supportive care.    2.  Acute hypoxic respiratory failure secondary to pneumonia -See problem #1. -Hypoxia improved with sats of 95% on room air. -IV antibiotics transition to oral antibiotics. -Continue Dulera, Spiriva, albuterol as needed.  3.  Fall/Debility -Unknown etiology -No acute trauma noted. -CT head CT C-spine with no acute abnormalities. -Patient seen by PT who are recommended SNF however family prefers to take patient home per PT note. -Spoke with patient's daughter who feels unsafe for patient to return home at this time and recommending SNF as patient noted to have increasing need for help with ADLs and ambulation. -TOC consult for SNF placement.  4.  Hypokalemia -Potassium at 3.6, magnesium at 2.0.   -Repeat labs in the AM.  5.  Diabetes mellitus type 2 -Last hemoglobin A1c 6.9% 07/2021. -CBG 122 this morning -SSI.  6.  History of odontoid process fracture -Patient noted to have suffered a type III odontoid process fracture in April followed by neurosurgery. -CT C-spine and head with stable findings noted. -Cervical collar changed to Aspen collar.    7.  Dementia -Supportive care.  8.  Transaminitis -Patient noted with elevated LFTs felt secondary to mild rhabdomyolysis. -CT abdomen and pelvis with no acute findings. -LFTs trending down with hydration.  9.  Mild rhabdomyolysis -Felt likely secondary to fall and found on the floor. -CK levels trended down at 228 from 529  from 1831. -IV fluids discontinued  10.  CKD stage II -Renal function stable. -IV fluids discontinued.  11.  Duodenitis -Duodenal wall thickening and surrounding edema noted in CT. -Abdominal exam benign. -Continue PPI. -Outpatient follow-up.  69.  Disposition -Spoke with patient's daughter over the telephone, who is concerned about patient's safety if  patient was to discharge home with home health.  It is noted per patient's daughter that patient's wife just got out of the hospital and blind in 1 eye and due to patient's increased level of care unsure whether wife could potentially take care of patient and her current state and recommending possible SNF placement. -TOC informed of SNF placement request.      DVT prophylaxis: Lovenox Code Status: Full Family Communication: Updated patient.  Updated daughter on the telephone.  Tried to call wife however phone went straight to voicemail.  Disposition: Home health PT versus SNF  Status is: Inpatient Remains inpatient appropriate because: Unsafe disposition   Consultants:  None  Procedures:  CT head CT C-spine 12/21/2021 CT chest abdomen and pelvis 12/21/2021 CT L-spine 12/21/2021  Antimicrobials:  Oral azithromycin 12/23/2021>>>> 12/26/2021 IV azithromycin 12/21/2021>>>> 12/23/2021 IV Rocephin 12/21/2021>>>>>> 12/25/2021 Omnicef 12/25/2021>>>>   Subjective: Sleeping deeply.  Objective: Vitals:   12/25/21 2123 12/26/21 0547 12/26/21 0729 12/26/21 0750  BP: (!) 146/81 (!) 155/92  (!) 146/86  Pulse: 69 62  78  Resp: 18 19    Temp: 97.6 F (36.4 C) 98 F (36.7 C)  97.8 F (36.6 C)  TempSrc: Oral Oral  Oral  SpO2: 94% 93% (S) (!) 86% 95%  Weight:      Height:        Intake/Output Summary (Last 24 hours) at 12/26/2021 1341 Last data filed at 12/26/2021 0925 Gross per 24 hour  Intake 685 ml  Output 1050 ml  Net -365 ml    Filed Weights   12/22/21 1612  Weight: 62 kg    Examination:  General exam: Appears calm, comfortable.  Cervical collar off.  Respiratory system: Lungs clear to auscultation bilaterally.  No wheezes, no crackles, no rhonchi.  Fair air movement. Cardiovascular system: RRR no murmurs rubs or gallops.  No JVD.  No lower extremity edema.  Gastrointestinal system: Abdomen is soft, nontender, nondistended, positive bowel sounds.  No rebound.  No guarding.   Central  nervous system: Alert. No focal neurological deficits. Extremities: Symmetric 5 x 5 power. Skin: No rashes, lesions or ulcers Psychiatry: Judgement and insight appear poor to fair. Mood & affect appropriate.     Data Reviewed: I have personally reviewed following labs and imaging studies  CBC: Recent Labs  Lab 12/21/21 1600 12/21/21 1607 12/22/21 0347 12/23/21 0156 12/24/21 0224 12/25/21 0138 12/26/21 0137  WBC 7.4  --  6.7 5.2 5.7 5.6 5.7  NEUTROABS 6.4  --   --   --   --   --   --   HGB 12.7*   < > 12.2* 10.8* 12.8* 11.3* 11.6*  HCT 38.2*   < > 35.1* 31.1* 40.0 33.6* 35.0*  MCV 91.2  --  89.1 89.1 93.9 90.1 92.3  PLT 173  --  160 153 170 206 238   < > = values in this interval not displayed.     Basic Metabolic Panel: Recent Labs  Lab 12/21/21 2154 12/22/21 0347 12/23/21 0156 12/24/21 0224 12/25/21 0138 12/26/21 0137  NA  --  137 135 139 139 140  K  --  3.2* 3.6 4.1 3.1* 3.6  CL  --  106 107 111 107 110  CO2  --  22 20* 20* 21* 22  GLUCOSE  --  105* 130* 106* 147* 119*  BUN  --  23 24* 21 16 12   CREATININE  --  1.12 0.99 0.89 0.80 0.79  CALCIUM  --  8.2* 7.9* 8.1* 8.3* 8.2*  MG 2.1 2.1  --   --  1.7 2.0     GFR: Estimated Creatinine Clearance: 71 mL/min (by C-G formula based on SCr of 0.79 mg/dL).  Liver Function Tests: Recent Labs  Lab 12/21/21 1600 12/22/21 0347 12/23/21 0156 12/24/21 0224 12/25/21 0138  AST 149* 142* 103* 66* 55*  ALT 95* 96* 86* 69* 61*  ALKPHOS 50 43 41 49 53  BILITOT 0.9 0.5 0.5 0.4 0.7  PROT 5.6* 4.9* 4.2* 4.4* 4.4*  ALBUMIN 2.5* 2.1* 1.7* 1.7* 1.8*     CBG: Recent Labs  Lab 12/25/21 2129 12/26/21 0001 12/26/21 0555 12/26/21 0753 12/26/21 1209  GLUCAP 147* 138* 117* 122* 125*      Recent Results (from the past 240 hour(s))  Resp Panel by RT-PCR (Flu A&B, Covid) Anterior Nasal Swab     Status: None   Collection Time: 12/21/21  6:01 PM   Specimen: Anterior Nasal Swab  Result Value Ref Range Status   SARS  Coronavirus 2 by RT PCR NEGATIVE NEGATIVE Final    Comment: (NOTE) SARS-CoV-2 target nucleic acids are NOT DETECTED.  The SARS-CoV-2 RNA is generally detectable in upper respiratory specimens during the acute phase of infection. The lowest concentration of SARS-CoV-2 viral copies this assay can detect is 138 copies/mL. A negative result does not preclude SARS-Cov-2 infection and should not be used as the sole basis for treatment or other patient management decisions. A negative result may occur with  improper specimen collection/handling, submission of specimen other than nasopharyngeal swab, presence of viral mutation(s) within the areas targeted by this assay, and inadequate number of viral copies(<138 copies/mL). A negative result must be combined with clinical observations, patient history, and epidemiological information. The expected result is Negative.  Fact Sheet for Patients:  02/21/22  Fact Sheet for Healthcare Providers:  BloggerCourse.com  This test is no t yet approved or cleared by the [place]ious[place]ker.[place] FDA and  has been authorized for detection and/or diagnosis of SARS-CoV-2 by FDA under an Emergency Use Authorization (EUA). This EUA will remain  in effect (meaning this test can be used) for the duration of the COVID-19 declaration under Section 564(b)(1) of the Act, 21 U.S.C.section 360bbb-3(b)(1), unless the authorization is terminated  or revoked sooner.       Influenza A by PCR NEGATIVE NEGATIVE Final   Influenza B by PCR NEGATIVE NEGATIVE Final    Comment: (NOTE) The Xpert Xpress SARS-CoV-2/FLU/RSV plus assay is intended as an aid in the diagnosis of influenza from Nasopharyngeal swab specimens and should not be used as a sole basis for treatment. Nasal washings and aspirates are unacceptable for Xpert Xpress SARS-CoV-2/FLU/RSV testing.  Fact Sheet for  Patients: [place]  Fact Sheet for Healthcare Providers: BloggerCourse.com  This test is not yet approved or cleared by the [place]ious[place]ker.[place] FDA and has been authorized for detection and/or diagnosis of SARS-CoV-2 by FDA under an Emergency Use Authorization (EUA). This EUA will remain in effect (meaning this test can be used) for the duration of the COVID-19 declaration under Section 564(b)(1) of the Act, 21 U.S.C. section 360bbb-3(b)(1), unless the authorization is terminated or revoked.  Performed at [facility], [place] [address], [place], [place] [address]   Blood culture (routine x 2)     Status: None   Collection Time: 12/21/21  6:17 PM   Specimen: BLOOD  Result Value Ref Range Status   Specimen Description BLOOD SITE NOT SPECIFIED  Final   Special Requests   Final    BOTTLES DRAWN AEROBIC AND ANAEROBIC Blood Culture adequate volume   Culture   Final    NO GROWTH 5 DAYS Performed at [facility], [address] [place], [place] [address]    Report Status 12/26/2021 FINAL  Final  Blood culture (routine x 2)     Status: None   Collection Time: 12/21/21  6:18 PM   Specimen: BLOOD  Result Value Ref Range Status   Specimen Description BLOOD SITE NOT SPECIFIED  Final   Special Requests   Final    BOTTLES DRAWN AEROBIC AND ANAEROBIC Blood Culture adequate volume   Culture   Final    NO GROWTH 5 DAYS Performed at [facility], [place] [address], [place], [place] [address]    Report Status 12/26/2021 FINAL  Final         Radiology Studies: No results found.      Scheduled Meds:  amLODipine  5 mg Oral Daily   cefdinir  300 mg Oral Q12H   clonazepam  1.25 mg Oral QHS   enoxaparin (LOVENOX) injection  40 mg Subcutaneous Q24H   insulin aspart  0-6 Units Subcutaneous Q4H   memantine  20 mg Oral QHS   mometasone-formoterol  2 puff Inhalation BID   naproxen  500 mg Oral BID   pantoprazole  40 mg  Oral Q24H   PARoxetine  60 mg Oral Daily   pramipexole  1 mg Oral QHS   prednisoLONE acetate  1 drop Left Eye BID   pregabalin  100 mg Oral BID   Ensure Max Protein   Oral Daily   rivastigmine  6 mg Oral BID WC   sodium chloride  1 drop Right Eye TID   sodium chloride flush  3 mL Intravenous Q12H   tamsulosin  0.4 mg Oral Daily   traZODone  50 mg Oral QHS   umeclidinium bromide  1 puff Inhalation Daily   vitamin B-12  500 mcg Oral Daily   Continuous Infusions:     LOS: 5 days    Time spent: 35 minutes    Irine Seal, MD [facility]   To contact the attending provider between 7A-7P or the covering provider during after hours 7P-7A, please log into the web site [email or web address] and access using universal [place] password for that web site. If you do not have the password, please call the hospital operator.  12/26/2021, 1:41 PM

## 2021-12-26 NOTE — TOC Progression Note (Signed)
Transition of Care [facility]) - Progression Note    Patient Details  Name: Benjamin Boyle MRN: [id number] Date of Birth: [birth year]  Transition of Care [facility]) CM/SW Contact  Asaiah Scarber [place], [place] Phone Number: 12/26/2021, 5:51 PM  Clinical Narrative:     Phone call to patient's spouse to confirm plan for SNF. Could not leave a voicemail, mail box was full. Will plan to call tomorrow.  Sakeenah Valcarcel, LCSW Transition of Care    Expected Discharge Plan: Home w Home Health Services    Expected Discharge Plan and Services Expected Discharge Plan: Home w Home Health Services   Discharge Planning Services: CM Consult                               HH Arranged: PT   Date [facility] Agency Contacted: 12/23/21 Time HH Agency Contacted: [phone] Representative spoke with at [facility] Agency: Arline Asp   Social Determinants of Health (SDOH) Interventions    Readmission Risk Interventions     No data to display

## 2021-12-26 NOTE — TOC Progression Note (Signed)
Transition of Care [facility]) - Progression Note    Patient Details  Name: SABAS FRETT MRN: [id number] Date of Birth: Sep 26, 1945  Transition of Care [facility]) CM/SW Contact  Bess Kinds, RN Phone Number: [phone] 12/26/2021, 1:51 PM  Clinical Narrative:     Spoke with patient's daughter, Victorino Dike, at [phone] to discuss post acute transition. At home patient has hospital bed, scooter, walk-in shower, and elevator (although broken), and RW and cane. Spouse has recently been hospitalized and not currently able to provide level of supervision and caregiving as needed.   Reviewed PT/OT recommendations for SNF with constant/frequent supervision. Lurena Joiner is interested in SNF and LTC options. Patient has [facility] benefits -TOC to follow up with [facility] on Monday to inquire about SNF vs LTC benefits. Demographics verified - family would prefer SNF close to home if possible. Noted previous RNCM identified patient's PCP and SW - PCP : Hloy Reita Chard SW [phone] ext. [phone]  Expected Discharge Plan: Home w Home Health Services    Expected Discharge Plan and Services Expected Discharge Plan: Home w Home Health Services   Discharge Planning Services: CM Consult                               HH Arranged: PT   Date [facility] Agency Contacted: 12/23/21 Time HH Agency Contacted: [phone] Representative spoke with at [facility] Agency: Arline Asp   Social Determinants of Health (SDOH) Interventions    Readmission Risk Interventions     No data to display

## 2021-12-26 NOTE — Progress Notes (Signed)
Second bladder scan and in/out cath since foley removed. Scan revealed 478 cath actually closer to 

## 2021-12-27 LAB — BASIC METABOLIC PANEL
Anion gap: 5 (ref 5–15)
BUN: 15 mg/dL (ref 8–23)
CO2: 23 mmol/L (ref 22–32)
Calcium: 8 mg/dL — ABNORMAL LOW (ref 8.9–10.3)
Chloride: 111 mmol/L (ref 98–111)
Creatinine, Ser: 0.84 mg/dL (ref 0.61–1.24)
GFR, Estimated: >60 mL/min (ref >60)
Glucose, Bld: 98 mg/dL (ref 70–99)
Potassium: 3.7 mmol/L (ref 3.5–5.1)
Sodium: 139 mmol/L (ref 135–145)

## 2021-12-27 LAB — GLUCOSE, CAPILLARY
Glucose-Capillary: 111 mg/dL — ABNORMAL HIGH (ref 70–99)
Glucose-Capillary: 134 mg/dL — ABNORMAL HIGH (ref 70–99)
Glucose-Capillary: 138 mg/dL — ABNORMAL HIGH (ref 70–99)
Glucose-Capillary: 175 mg/dL — ABNORMAL HIGH (ref 70–99)
Glucose-Capillary: 201 mg/dL — ABNORMAL HIGH (ref 70–99)
Glucose-Capillary: 88 mg/dL (ref 70–99)
Glucose-Capillary: 96 mg/dL (ref 70–99)

## 2021-12-27 MED ORDER — INSULIN ASPART 100 UNIT/ML IJ SOLN
0.0000 [IU] | Freq: Three times a day (TID) | INTRAMUSCULAR | Status: DC
  Administered 2021-12-30: 1 [IU] via SUBCUTANEOUS

## 2021-12-27 NOTE — Progress Notes (Signed)
PROGRESS NOTE    Benjamin Boyle  [id number] DOB: [birth year] DOA: 12/21/2021 PCP: [facility]    Chief Complaint  Patient presents with   Fall    Brief Narrative:  Benjamin Boyle is a pleasant 76 y.o. male with past medical history of dementia, type 2 diabetes, hypertension  presented to the hospital after he was found down by EMS. The patient was found under his bed and may have been there for over 24 hours.   In the ED, patient was febrile with pulse ox of 90 on 2 L of supplemental oxygen.  He was noted to be tachycardic, tachypneic but had stable blood pressure.  EKG showed normal sinus rhythm.  CT head scan and C-spine was negative.  CT chest showed dense airspace consolidation in the right middle lobe and patchy patient in the right lower lobe suggestive of multifocal pneumonia.  Patient was then admitted hospital for further evaluation and treatment.   Assessment & Plan:   Principal Problem:   Multifocal pneumonia Active Problems:   Acute respiratory failure with hypoxia (HCC)   CAD (coronary artery disease)   Hypertension   Hypokalemia   Elevated transaminase level   Diabetes (HCC)   Duodenitis   Dementia (HCC)   Closed type III fracture of odontoid process, sequela   Sepsis ([place])   Fall at home, initial encounter   Debility   Acute urinary retention  #1 sepsis secondary to multifocal pneumonia, POA -Patient presented noted to be febrile, tachycardic, hypoxic found to be down on the floor. -CT chest done concerning for multifocal pneumonia. -Urine strep pneumococcus antigen negative, urine Legionella antigen negative. -Blood cultures with no growth to date x4 days. -Sputum Gram stain and cultures pending. -Influenza A/B PCR negative, SARS coronavirus 2 PCR negative. -Procalcitonin noted elevated at 2.09 trended down currently at 1.26. -Hypoxia improving currently with sats of 92-96 % on room air -Status post 5 days azithromycin.   -Transitioned from  IV Rocephin to oral Omnicef to complete a 7-day course of antibiotic treatment.  -Continue Incruse, Dulera.  Albuterol as needed.   -Supportive care.    2.  Acute hypoxic respiratory failure secondary to pneumonia -See problem #1. -Hypoxia improved with sats of 96% on room air. -IV antibiotics transition to oral antibiotics. -Continue Dulera, Spiriva, albuterol as needed.  3.  Fall/Debility -Unknown etiology -No acute trauma noted. -CT head CT C-spine with no acute abnormalities. -Patient seen by PT who are recommended SNF however family prefers to take patient home per PT note. -Spoke with patient's daughter who feels unsafe for patient to return home at this time and recommending SNF as patient noted to have increasing need for help with ADLs and ambulation. -TOC consult for SNF placement.  4.  Hypokalemia -Potassium at 3.7, magnesium at 2.0.   -Repeat labs in the AM.  5.  Diabetes mellitus type 2 -Last hemoglobin A1c 6.9% 07/2021. -CBG 96 this morning -SSI.  6.  History of odontoid process fracture -Patient noted to have suffered a type III odontoid process fracture in April followed by neurosurgery. -CT C-spine and head with stable findings noted. -Cervical collar changed to Aspen collar.    7.  Dementia -Supportive care.  8.  Transaminitis -Patient noted with elevated LFTs felt secondary to mild rhabdomyolysis. -CT abdomen and pelvis with no acute findings. -LFTs trending down with hydration.  9.  Mild rhabdomyolysis -Felt likely secondary to fall and found on the floor. -CK levels trended down at 228 from  529 from 1831. -IV fluids discontinued -Supportive care.  10.  CKD stage II -Renal function stable. -IV fluids discontinued.  11.  Duodenitis -Duodenal wall thickening and surrounding edema noted in CT. -Abdominal exam benign. -Continue PPI. -Outpatient follow-up.  12.  Disposition -Spoke with patient's daughter on 12/26/2021, over the telephone, who is  concerned about patient's safety if patient was to discharge home with home health.  It is noted per patient's daughter that patient's wife just got out of the hospital and blind in 1 eye and due to patient's increased level of care unsure whether wife could potentially take care of patient and her current state and recommending possible SNF placement. -TOC informed of SNF placement request.      DVT prophylaxis: Lovenox Code Status: Full Family Communication: Updated patient.  Updated daughter on the telephone.  Tried to call wife however phone went straight to voicemail.  Disposition: Home health PT versus SNF  Status is: Inpatient Remains inpatient appropriate because: Unsafe disposition   Consultants:  None  Procedures:  CT head CT C-spine 12/21/2021 CT chest abdomen and pelvis 12/21/2021 CT L-spine 12/21/2021  Antimicrobials:  Oral azithromycin 12/23/2021>>>> 12/26/2021 IV azithromycin 12/21/2021>>>> 12/23/2021 IV Rocephin 12/21/2021>>>>>> 12/25/2021 Omnicef 12/25/2021>>>>   Subjective: Sitting up in bed.  No chest pain.  No shortness of breath.  No abdominal pain.  Some bouts of confusion noted.  Objective: Vitals:   12/27/21 0745 12/27/21 0822 12/27/21 0825 12/27/21 0905  BP:    140/76  Pulse:    (!) 51  Resp:    18  Temp:    (!) 97.1 F (36.2 C)  TempSrc:      SpO2: 95% 94% 94% 96%  Weight:      Height:        Intake/Output Summary (Last 24 hours) at 12/27/2021 1231 Last data filed at 12/27/2021 0900 Gross per 24 hour  Intake 240 ml  Output 1000 ml  Net -760 ml    Filed Weights   12/22/21 1612  Weight: 62 kg    Examination:  General exam: NAD.  Cervical collar o off. Respiratory system: Some coarse breath sounds in the bases otherwise clear.  No wheezing.  No crackles noted.  Cardiovascular system: Regular rate rhythm no murmurs rubs or gallops.  No JVD.  No lower extremity edema. Gastrointestinal system: Abdomen is soft, nontender, nondistended, positive bowel  sounds.  No rebound.  No guarding.   Central nervous system: Alert. No focal neurological deficits. Extremities: Symmetric 5 x 5 power. Skin: No rashes, lesions or ulcers Psychiatry: Judgement and insight appear poor to fair. Mood & affect appropriate.     Data Reviewed: I have personally reviewed following labs and imaging studies  CBC: Recent Labs  Lab 12/21/21 1600 12/21/21 1607 12/22/21 0347 12/23/21 0156 12/24/21 0224 12/25/21 0138 12/26/21 0137  WBC 7.4  --  6.7 5.2 5.7 5.6 5.7  NEUTROABS 6.4  --   --   --   --   --   --   HGB 12.7*   < > 12.2* 10.8* 12.8* 11.3* 11.6*  HCT 38.2*   < > 35.1* 31.1* 40.0 33.6* 35.0*  MCV 91.2  --  89.1 89.1 93.9 90.1 92.3  PLT 173  --  160 153 170 206 238   < > = values in this interval not displayed.     Basic Metabolic Panel: Recent Labs  Lab 12/21/21 2154 12/22/21 0347 12/23/21 0156 12/24/21 0224 12/25/21 0138 12/26/21 0137 12/27/21 0135  NA  --  137 135 139 139 140 139  K  --  3.2* 3.6 4.1 3.1* 3.6 3.7  CL  --  106 107 111 107 110 111  CO2  --  22 20* 20* 21* 22 23  GLUCOSE  --  105* 130* 106* 147* 119* 98  BUN  --  23 24* 21 16 12 15   CREATININE  --  1.12 0.99 0.89 0.80 0.79 0.84  CALCIUM  --  8.2* 7.9* 8.1* 8.3* 8.2* 8.0*  MG 2.1 2.1  --   --  1.7 2.0  --      GFR: Estimated Creatinine Clearance: 67.7 mL/min (by C-G formula based on SCr of 0.84 mg/dL).  Liver Function Tests: Recent Labs  Lab 12/21/21 1600 12/22/21 0347 12/23/21 0156 12/24/21 0224 12/25/21 0138  AST 149* 142* 103* 66* 55*  ALT 95* 96* 86* 69* 61*  ALKPHOS 50 43 41 49 53  BILITOT 0.9 0.5 0.5 0.4 0.7  PROT 5.6* 4.9* 4.2* 4.4* 4.4*  ALBUMIN 2.5* 2.1* 1.7* 1.7* 1.8*     CBG: Recent Labs  Lab 12/26/21 2033 12/27/21 0011 12/27/21 0401 12/27/21 0632 12/27/21 0902  GLUCAP 275* 111* 138* 96 201*      Recent Results (from the past 240 hour(s))  Resp Panel by RT-PCR (Flu A&B, Covid) Anterior Nasal Swab     Status: None   Collection  Time: 12/21/21  6:01 PM   Specimen: Anterior Nasal Swab  Result Value Ref Range Status   SARS Coronavirus 2 by RT PCR NEGATIVE NEGATIVE Final    Comment: (NOTE) SARS-CoV-2 target nucleic acids are NOT DETECTED.  The SARS-CoV-2 RNA is generally detectable in upper respiratory specimens during the acute phase of infection. The lowest concentration of SARS-CoV-2 viral copies this assay can detect is 138 copies/mL. A negative result does not preclude SARS-Cov-2 infection and should not be used as the sole basis for treatment or other patient management decisions. A negative result may occur with  improper specimen collection/handling, submission of specimen other than nasopharyngeal swab, presence of viral mutation(s) within the areas targeted by this assay, and inadequate number of viral copies(<138 copies/mL). A negative result must be combined with clinical observations, patient history, and epidemiological information. The expected result is Negative.  Fact Sheet for Patients:  EntrepreneurPulse.com.au  Fact Sheet for Healthcare Providers:  IncredibleEmployment.be  This test is no t yet approved or cleared by the [place] FDA and  has been authorized for detection and/or diagnosis of SARS-CoV-2 by FDA under an Emergency Use Authorization (EUA). This EUA will remain  in effect (meaning this test can be used) for the duration of the COVID-19 declaration under Section 564(b)(1) of the Act, 21 U.S.C.section 360bbb-3(b)(1), unless the authorization is terminated  or revoked sooner.       Influenza A by PCR NEGATIVE NEGATIVE Final   Influenza B by PCR NEGATIVE NEGATIVE Final    Comment: (NOTE) The Xpert Xpress SARS-CoV-2/FLU/RSV plus assay is intended as an aid in the diagnosis of influenza from Nasopharyngeal swab specimens and should not be used as a sole basis for treatment. Nasal washings and aspirates are unacceptable for Xpert Xpress  SARS-CoV-2/FLU/RSV testing.  Fact Sheet for Patients: EntrepreneurPulse.com.au  Fact Sheet for Healthcare Providers: IncredibleEmployment.be  This test is not yet approved or cleared by the [place] FDA and has been authorized for detection and/or diagnosis of SARS-CoV-2 by FDA under an Emergency Use Authorization (EUA). This EUA will remain in effect (meaning this test can be used) for  the duration of the COVID-19 declaration under Section 564(b)(1) of the Act, 21 U.S.C. section 360bbb-3(b)(1), unless the authorization is terminated or revoked.  Performed at [facility], [address] [place], [place] [address]   Blood culture (routine x 2)     Status: None   Collection Time: 12/21/21  6:17 PM   Specimen: BLOOD  Result Value Ref Range Status   Specimen Description BLOOD SITE NOT SPECIFIED  Final   Special Requests   Final    BOTTLES DRAWN AEROBIC AND ANAEROBIC Blood Culture adequate volume   Culture   Final    NO GROWTH 5 DAYS Performed at [facility], [address], [place], [place] [address]    Report Status 12/26/2021 FINAL  Final  Blood culture (routine x 2)     Status: None   Collection Time: 12/21/21  6:18 PM   Specimen: BLOOD  Result Value Ref Range Status   Specimen Description BLOOD SITE NOT SPECIFIED  Final   Special Requests   Final    BOTTLES DRAWN AEROBIC AND ANAEROBIC Blood Culture adequate volume   Culture   Final    NO GROWTH 5 DAYS Performed at [facility], [address], [place], [place] [address]    Report Status 12/26/2021 FINAL  Final         Radiology Studies: No results found.      Scheduled Meds:  amLODipine  5 mg Oral Daily   cefdinir  300 mg Oral Q12H   clonazepam  1.25 mg Oral QHS   enoxaparin (LOVENOX) injection  40 mg Subcutaneous Q24H   insulin aspart  0-6 Units Subcutaneous Q4H   memantine  20 mg Oral QHS   mometasone-formoterol  2 puff Inhalation BID    naproxen  500 mg Oral BID   pantoprazole  40 mg Oral Q24H   PARoxetine  60 mg Oral Daily   pramipexole  1 mg Oral QHS   prednisoLONE acetate  1 drop Left Eye BID   pregabalin  100 mg Oral BID   Ensure Max Protein   Oral Daily   rivastigmine  6 mg Oral BID WC   sodium chloride  1 drop Right Eye TID   sodium chloride flush  3 mL Intravenous Q12H   tamsulosin  0.4 mg Oral Daily   traZODone  50 mg Oral QHS   umeclidinium bromide  1 puff Inhalation Daily   vitamin B-12  500 mcg Oral Daily   Continuous Infusions:     LOS: 6 days    Time spent: 35 minutes    Ramiro Harvest, MD [facility]   To contact the attending provider between 7A-7P or the covering provider during after hours 7P-7A, please log into the web site [email or web address] and access using universal [place] password for that web site. If you do not have the password, please call the hospital operator.  12/27/2021, 12:31 PM

## 2021-12-28 LAB — GLUCOSE, CAPILLARY
Glucose-Capillary: 112 mg/dL — ABNORMAL HIGH (ref 70–99)
Glucose-Capillary: 110 mg/dL — ABNORMAL HIGH (ref 70–99)
Glucose-Capillary: 129 mg/dL — ABNORMAL HIGH (ref 70–99)
Glucose-Capillary: 123 mg/dL — ABNORMAL HIGH (ref 70–99)

## 2021-12-28 LAB — CBC
HCT: 31.8 % — ABNORMAL LOW (ref 39.0–52.0)
Hemoglobin: 10.5 g/dL — ABNORMAL LOW (ref 13.0–17.0)
MCH: 29.9 pg (ref 26.0–34.0)
MCHC: 33 g/dL (ref 30.0–36.0)
MCV: 90.6 fL (ref 80.0–100.0)
Platelets: 343 10*3/uL (ref 150–400)
RBC: 3.51 MIL/uL — ABNORMAL LOW (ref 4.22–5.81)
RDW: 14.4 % (ref 11.5–15.5)
WBC: 5.9 10*3/uL (ref 4.0–10.5)
nRBC: 0 % (ref 0.0–0.2)

## 2021-12-28 LAB — BASIC METABOLIC PANEL
Anion gap: 11 (ref 5–15)
BUN: 17 mg/dL (ref 8–23)
CO2: 21 mmol/L — ABNORMAL LOW (ref 22–32)
Calcium: 8.1 mg/dL — ABNORMAL LOW (ref 8.9–10.3)
Chloride: 107 mmol/L (ref 98–111)
Creatinine, Ser: 0.89 mg/dL (ref 0.61–1.24)
GFR, Estimated: >60 mL/min (ref >60)
Glucose, Bld: 107 mg/dL — ABNORMAL HIGH (ref 70–99)
Potassium: 3.8 mmol/L (ref 3.5–5.1)
Sodium: 139 mmol/L (ref 135–145)

## 2021-12-28 NOTE — Progress Notes (Signed)
Physical Therapy Treatment Patient Details Name: Benjamin Boyle MRN: [id number] DOB: 1946/02/02 Today's Date: 12/28/2021   History of Present Illness Pt is 76 y.o. M presenting 12/21/2021 after he was found down.  EMS reported that patient was found under his bed and they thought he may have been down for 24 hours. Pt admitted with sepsis due to pneumonia. Significant PMH: dementia, type 2 diabetes mellitus, hypertension, and odontoid fracture.    PT Comments    Treatment session limited by lethargy and poor command following. Pt requiring up to two person maximal assist for bed mobility and transfers. Pt demonstrating heavy retropulsion. Attempted to work on anterior weight shift with reaching for drink/food tray, but pt with decreased initiation and requiring maxA to bring hand to mouth. Recommend SNF to address deficits, maximize functional mobility and decrease caregiver burden.    Recommendations for follow up therapy are one component of a multi-disciplinary discharge planning process, led by the attending physician.  Recommendations may be updated based on patient status, additional functional criteria and insurance authorization.  Follow Up Recommendations  Skilled nursing-short term rehab (<3 hours/day) Can patient physically be transported by private vehicle: No   Assistance Recommended at Discharge Frequent or constant Supervision/Assistance  Patient can return home with the following Two people to help with walking and/or transfers;A lot of help with bathing/dressing/bathroom;Help with stairs or ramp for entrance;Assist for transportation;Direct supervision/assist for medications management;Assistance with cooking/housework   Equipment Recommendations  BSC/3in1;Hospital bed;Other (comment) (hoyer lift)    Recommendations for Other Services       Precautions / Restrictions Precautions Precautions: Fall;Cervical Required Braces or Orthoses: Cervical Brace Cervical Brace: Hard  collar;At all times Restrictions Weight Bearing Restrictions: No     Mobility  Bed Mobility Overal bed mobility: Needs Assistance Bed Mobility: Rolling, Sidelying to Sit, Sit to Sidelying Rolling: Max assist Sidelying to sit: Max assist, +2 for physical assistance     Sit to sidelying: Max assist, +2 for physical assistance General bed mobility comments: Rolling towards left with maxA and use of bed pad to guide hips into sidelying position. MaxA + 2 for BLE and trunk assist for sidelying <> sit. Decreased initiation noted by pt    Transfers Overall transfer level: Needs assistance Equipment used: Rolling walker (2 wheels) Transfers: Sit to/from Stand Sit to Stand: Max assist, +2 physical assistance           General transfer comment: Manual foot placement and anterior foot block provided to prevent sliding forward, assist to boost up partial standing, retropulsion.    Ambulation/Gait               General Gait Details: unable   Stairs             Wheelchair Mobility    Modified Rankin (Stroke Patients Only)       Balance Overall balance assessment: Needs assistance Sitting-balance support: Bilateral upper extremity supported Sitting balance-Leahy Scale: Poor Sitting balance - Comments: requiring mod-maxA, heavy posterior lean.   Standing balance support: Bilateral upper extremity supported Standing balance-Leahy Scale: Zero                              Cognition Arousal/Alertness: Awake/alert Behavior During Therapy: Flat affect Overall Cognitive Status: History of cognitive impairments - at baseline  General Comments: Aroused pt from sleep; poor command following and difficult to understand speech.        Exercises      General Comments        Pertinent Vitals/Pain Pain Assessment Pain Assessment: Faces Faces Pain Scale: Hurts little more Pain Location: generalized with  movement Pain Descriptors / Indicators: Grimacing Pain Intervention(s): Monitored during session    Home Living                          Prior Function            PT Goals (current goals can now be found in the care plan section) Acute Rehab PT Goals Patient Stated Goal: none stated Progress towards PT goals: Not progressing toward goals - comment    Frequency    Min 3X/week      PT Plan Current plan remains appropriate    Co-evaluation              AM-PAC PT "6 Clicks" Mobility   Outcome Measure  Help needed turning from your back to your side while in a flat bed without using bedrails?: A Lot Help needed moving from lying on your back to sitting on the side of a flat bed without using bedrails?: Total Help needed moving to and from a bed to a chair (including a wheelchair)?: Total Help needed standing up from a chair using your arms (e.g., wheelchair or bedside chair)?: Total Help needed to walk in hospital room?: Total Help needed climbing 3-5 steps with a railing? : Total 6 Click Score: 7    End of Session Equipment Utilized During Treatment: Gait belt Activity Tolerance: Patient limited by fatigue Patient left: in bed;with call bell/phone within reach;with bed alarm set Nurse Communication: Mobility status PT Visit Diagnosis: Other abnormalities of gait and mobility (R26.89);History of falling (Z91.81);Muscle weakness (generalized) (M62.81)     Time: 7106-2694 PT Time Calculation (min) (ACUTE ONLY): 25 min  Charges:  $Therapeutic Activity: 23-37 mins                     Lillia Pauls, PT, DPT Acute Rehabilitation Services Office [phone]    Norval Morton 12/28/2021, 3:46 PM

## 2021-12-28 NOTE — [place]2 (Signed)
[place] MEDICAID FL2 LEVEL OF CARE SCREENING TOOL     IDENTIFICATION  Patient Name: Benjamin Boyle Birthdate: Apr 25, 1946 Sex: male Admission Date (Current Location): 12/21/2021  [facility] and [place] Number:  [employer] and Address:  [facility] [place], [address] [place], [place] [address]      Provider Number: [id number]  Attending Physician Name and Address:  Rodolph Bong, MD  Relative Name and Phone Number:       Current Level of Care: Hospital Recommended Level of Care: Skilled Nursing Facility Prior Approval Number:    Date Approved/Denied:   PASRR Number: [id number]  Discharge Plan: Home    Current Diagnoses: Patient Active Problem List   Diagnosis Date Noted   Acute urinary retention 12/26/2021   Sepsis (HCC) 12/22/2021   Fall at home, initial encounter 12/22/2021   Debility 12/22/2021   Multifocal pneumonia 12/21/2021   Acute respiratory failure with hypoxia (HCC) 12/21/2021   Closed type III fracture of odontoid process, sequela 12/21/2021   CAD (coronary artery disease)    Hypertension    Hypokalemia    Elevated transaminase level    Diabetes (HCC)    Duodenitis    Dementia (HCC)    Iron deficiency anemia 08/28/2021    Orientation RESPIRATION BLADDER Height & Weight     Self, Place, Time  Normal Continent Weight: 62 kg Height:  5\' 9"  (175.3 cm)  BEHAVIORAL SYMPTOMS/MOOD NEUROLOGICAL BOWEL NUTRITION STATUS      Continent Diet  AMBULATORY STATUS COMMUNICATION OF NEEDS Skin   Extensive Assist Verbally Normal                       Personal Care Assistance Level of Assistance  Bathing, Feeding, Dressing Bathing Assistance: Maximum assistance Feeding assistance: Limited assistance Dressing Assistance: Maximum assistance     Functional Limitations Info  Sight, Hearing, Speech Sight Info: Adequate Hearing Info: Adequate Speech Info: Adequate    SPECIAL CARE FACTORS FREQUENCY  PT (By licensed PT),  OT (By licensed OT)     PT Frequency: PT at SNF to eval and treat a minimum of 5x/week OT Frequency: OT at SNF to eval and treat a minimum of 5x/week            Contractures Contractures Info: Not present    Additional Factors Info  Code Status, Allergies Code Status Info: Full Allergies Info: codeine, aricept, glipizide, neurontin, zomig           Current Medications (12/28/2021):  This is the current hospital active medication list Current Facility-Administered Medications  Medication Dose Route Frequency Provider Last Rate Last Admin   acetaminophen (TYLENOL) tablet 650 mg  650 mg Oral Q6H PRN Opyd, 02/28/2022, MD   650 mg at 12/27/21 1059   Or   acetaminophen (TYLENOL) suppository 650 mg  650 mg Rectal Q6H PRN Opyd, 02/27/22, MD       amLODipine (NORVASC) tablet 5 mg  5 mg Oral Daily Lavone Neri, MD   5 mg at 12/28/21 0908   clonazePAM (KLONOPIN) disintegrating tablet 1.25 mg  1.25 mg Oral QHS Pokhrel, Laxman, MD   1.25 mg at 12/27/21 2230   enoxaparin (LOVENOX) injection 40 mg  40 mg Subcutaneous Q24H Opyd, 02/27/22, MD   40 mg at 12/27/21 2231   insulin aspart (novoLOG) injection 0-6 Units  0-6 Units Subcutaneous TID WC 02/27/22, MD       ipratropium-albuterol (DUONEB) 0.5-2.5 (  3) MG/3ML nebulizer solution 3 mL  3 mL Nebulization Q2H PRN Rodolph Bong, MD       memantine [facility]) tablet 20 mg  20 mg Oral QHS Pokhrel, Laxman, MD   20 mg at 12/27/21 2231   mometasone-formoterol (DULERA) 200-5 MCG/ACT inhaler 2 puff  2 puff Inhalation BID Rodolph Bong, MD   2 puff at 12/28/21 0849   naproxen (NAPROSYN) tablet 500 mg  500 mg Oral BID Pokhrel, Laxman, MD   500 mg at 12/28/21 7793   oxyCODONE (Oxy IR/ROXICODONE) immediate release tablet 5 mg  5 mg Oral Q6H PRN Pokhrel, Laxman, MD       pantoprazole (PROTONIX) EC tablet 40 mg  40 mg Oral Q24H Rodolph Bong, MD   40 mg at 12/27/21 2230   PARoxetine (PAXIL) tablet 60 mg  60 mg Oral Daily Pokhrel,  Laxman, MD   60 mg at 12/28/21 9030   pramipexole (MIRAPEX) tablet 1 mg  1 mg Oral QHS Pokhrel, Laxman, MD   1 mg at 12/27/21 2231   prednisoLONE acetate (PRED FORTE) 1 % ophthalmic suspension 1 drop  1 drop Left Eye BID Pokhrel, Laxman, MD   1 drop at 12/28/21 0909   pregabalin (LYRICA) capsule 100 mg  100 mg Oral BID Pokhrel, Laxman, MD   100 mg at 12/28/21 0923   protein supplement (ENSURE MAX) liquid   Oral Daily Pokhrel, Laxman, MD   Given at 12/28/21 0909   rivastigmine (EXELON) capsule 6 mg  6 mg Oral BID WC Pokhrel, Laxman, MD   6 mg at 12/27/21 1713   senna-docusate (Senokot-S) tablet 1 tablet  1 tablet Oral QHS PRN Opyd, Lavone Neri, MD       sodium chloride (MURO 128) 5 % ophthalmic solution 1 drop  1 drop Right Eye TID Pokhrel, Laxman, MD   1 drop at 12/28/21 0909   sodium chloride flush (NS) 0.9 % injection 3 mL  3 mL Intravenous Q12H Opyd, Lavone Neri, MD   3 mL at 12/28/21 0910   tamsulosin (FLOMAX) capsule 0.4 mg  0.4 mg Oral Daily Pokhrel, Laxman, MD   0.4 mg at 12/28/21 0908   traZODone (DESYREL) tablet 50 mg  50 mg Oral QHS Pokhrel, Laxman, MD   50 mg at 12/27/21 2231   umeclidinium bromide (INCRUSE ELLIPTA) 62.5 MCG/ACT 1 puff  1 puff Inhalation Daily Lodema Hong A, RPH   1 puff at 12/28/21 0849   vitamin B-12 (CYANOCOBALAMIN) tablet 500 mcg  500 mcg Oral Daily Pokhrel, Laxman, MD   500 mcg at 12/28/21 0907     Discharge Medications: Please see discharge summary for a list of discharge medications.  Relevant Imaging Results:  Relevant Lab Results:   Additional Information SSN [id number]/ In addition to [place] also has plain Medicare [id number]  Bess Kinds, RN

## 2021-12-28 NOTE — TOC Initial Note (Signed)
Transition of Care [facility]) - Initial/Assessment Note    Patient Details  Name: Benjamin Boyle MRN: [id number] Date of Birth: Jan 10, 1946  [facility] CM/SW Contact:    Lorri Frederick, LCSW Phone Number: 12/28/2021, 4:11 PM  Clinical Narrative:    Pt oriented x2, was able to speak with CSW but not discuss DC planning.  CSW spoke with pt daughter Victorino Dike by phone. She does want to pursue SNF placement.  Pt lives at home with wife but wife has had recent medical problems as well, not able to provide as much care.   Discussed [facility] SNF process, Victorino Dike said pt does have medicare as well, will find the information.  She would be interested in using the medicare for more SNF choice.  1600: TC Jennifer.  Pt has Medicare A and B: [id number].  She will be at the hospital this evening. Choice document left in room for Victorino Dike, she would like to pursue [employer] options for SNF.              Expected Discharge Plan: Skilled Nursing Facility Barriers to Discharge: Continued Medical Work up, SNF Pending bed offer   Patient Goals and CMS Choice   CMS Medicare.gov Compare Post Acute Care list provided to:: Patient Represenative (must comment) (daughter Victorino Dike) Choice offered to / list presented to : Adult Children  Expected Discharge Plan and Services Expected Discharge Plan: Skilled Nursing Facility In-house Referral: Clinical Social Work Discharge Planning Services: CM Consult Post Acute Care Choice: Skilled Nursing Facility Living arrangements for the past 2 months: Single Family Home                           HH Arranged: PT   Date HH Agency Contacted: 12/23/21 Time HH Agency Contacted: 1516 Representative spoke with at [facility] Agency: Arline Asp  Prior Living Arrangements/Services Living arrangements for the past 2 months: Single Family Home Lives with:: Spouse Patient language and need for interpreter reviewed:: Yes Do you feel safe going back to the place where  you live?: Yes      Need for Family Participation in Patient Care: Yes (Comment) Care giver support system in place?: Yes (comment) Current home services: Other (comment) (none) Criminal Activity/Legal Involvement Pertinent to Current Situation/Hospitalization: No - Comment as needed  Activities of Daily Living Home Assistive Devices/Equipment: Cane (specify quad or straight) ADL Screening (condition at time of admission) Patient's cognitive ability adequate to safely complete daily activities?: Yes Is the patient deaf or have difficulty hearing?: No Does the patient have difficulty seeing, even when wearing glasses/contacts?: No Does the patient have difficulty concentrating, remembering, or making decisions?: No Patient able to express need for assistance with ADLs?: Yes Does the patient have difficulty dressing or bathing?: Yes Independently performs ADLs?: No Does the patient have difficulty walking or climbing stairs?: Yes Weakness of Legs: Both Weakness of Arms/Hands: None  Permission Sought/Granted                  Emotional Assessment Appearance:: Appears stated age Attitude/Demeanor/Rapport: Engaged Affect (typically observed): Pleasant Orientation: : Oriented to Self, Oriented to Place      Admission diagnosis:  Multifocal pneumonia [J18.9] Patient Active Problem List   Diagnosis Date Noted   Acute urinary retention 12/26/2021   Sepsis (HCC) 12/22/2021   Fall at home, initial encounter 12/22/2021   Debility 12/22/2021   Multifocal pneumonia 12/21/2021   Acute respiratory failure with hypoxia (HCC) 12/21/2021  Closed type III fracture of odontoid process, sequela 12/21/2021   CAD (coronary artery disease)    Hypertension    Hypokalemia    Elevated transaminase level    Diabetes (HCC)    Duodenitis    Dementia (HCC)    Iron deficiency anemia 08/28/2021   PCP:  Clinic, [facility] Pharmacy:   [facility] - [place], [place] -  [address] [place] [address] Phone: [phone] Fax: [phone]  [facility] [address] [place] - [address] [facility] [place] [address] Phone: [phone] Fax: [phone]     Social Determinants of Health (SDOH) Interventions    Readmission Risk Interventions     No data to display

## 2021-12-28 NOTE — Plan of Care (Signed)
No acute events overnight.    Problem: Activity: Goal: Ability to tolerate increased activity will improve Outcome: Progressing   Problem: Clinical Measurements: Goal: Ability to maintain a body temperature in the normal range will improve Outcome: Progressing   Problem: Respiratory: Goal: Ability to maintain adequate ventilation will improve Outcome: Progressing Goal: Ability to maintain a clear airway will improve Outcome: Progressing   Problem: Education: Goal: Ability to describe self-care measures that may prevent or decrease complications (Diabetes Survival Skills Education) will improve Outcome: Progressing Goal: Individualized Educational Video(s) Outcome: Progressing   Problem: Coping: Goal: Ability to adjust to condition or change in health will improve Outcome: Progressing   Problem: Fluid Volume: Goal: Ability to maintain a balanced intake and output will improve Outcome: Progressing   Problem: Health Behavior/Discharge Planning: Goal: Ability to identify and utilize available resources and services will improve Outcome: Progressing Goal: Ability to manage health-related needs will improve Outcome: Progressing   Problem: Metabolic: Goal: Ability to maintain appropriate glucose levels will improve Outcome: Progressing   Problem: Nutritional: Goal: Maintenance of adequate nutrition will improve Outcome: Progressing Goal: Progress toward achieving an optimal weight will improve Outcome: Progressing   Problem: Skin Integrity: Goal: Risk for impaired skin integrity will decrease Outcome: Progressing   Problem: Tissue Perfusion: Goal: Adequacy of tissue perfusion will improve Outcome: Progressing   Problem: Education: Goal: Knowledge of General Education information will improve Description: Including pain rating scale, medication(s)/side effects and non-pharmacologic comfort measures Outcome: Progressing   Problem: Health Behavior/Discharge  Planning: Goal: Ability to manage health-related needs will improve Outcome: Progressing   Problem: Clinical Measurements: Goal: Ability to maintain clinical measurements within normal limits will improve Outcome: Progressing Goal: Will remain free from infection Outcome: Progressing Goal: Diagnostic test results will improve Outcome: Progressing Goal: Respiratory complications will improve Outcome: Progressing Goal: Cardiovascular complication will be avoided Outcome: Progressing   Problem: Activity: Goal: Risk for activity intolerance will decrease Outcome: Progressing   Problem: Nutrition: Goal: Adequate nutrition will be maintained Outcome: Progressing   Problem: Coping: Goal: Level of anxiety will decrease Outcome: Progressing   Problem: Elimination: Goal: Will not experience complications related to bowel motility Outcome: Progressing Goal: Will not experience complications related to urinary retention Outcome: Progressing   Problem: Pain Managment: Goal: General experience of comfort will improve Outcome: Progressing   Problem: Safety: Goal: Ability to remain free from injury will improve Outcome: Progressing   Problem: Skin Integrity: Goal: Risk for impaired skin integrity will decrease Outcome: Progressing   Problem: Education: Goal: Knowledge of General Education information will improve Description: Including pain rating scale, medication(s)/side effects and non-pharmacologic comfort measures Outcome: Progressing   Problem: Health Behavior/Discharge Planning: Goal: Ability to manage health-related needs will improve Outcome: Progressing   Problem: Clinical Measurements: Goal: Ability to maintain clinical measurements within normal limits will improve Outcome: Progressing Goal: Will remain free from infection Outcome: Progressing Goal: Diagnostic test results will improve Outcome: Progressing Goal: Respiratory complications will  improve Outcome: Progressing Goal: Cardiovascular complication will be avoided Outcome: Progressing   Problem: Activity: Goal: Risk for activity intolerance will decrease Outcome: Progressing   Problem: Nutrition: Goal: Adequate nutrition will be maintained Outcome: Progressing   Problem: Coping: Goal: Level of anxiety will decrease Outcome: Progressing   Problem: Elimination: Goal: Will not experience complications related to bowel motility Outcome: Progressing Goal: Will not experience complications related to urinary retention Outcome: Progressing   Problem: Pain Managment: Goal: General experience of comfort will improve Outcome: Progressing   Problem: Safety: Goal: Ability   to remain free from injury will improve Outcome: Progressing   Problem: Skin Integrity: Goal: Risk for impaired skin integrity will decrease Outcome: Progressing

## 2021-12-28 NOTE — Progress Notes (Signed)
PROGRESS NOTE    Benjamin Boyle  MHD:[id number] DOB: 1945-08-14 DOA: 12/21/2021 PCP: [facility]    Chief Complaint  Patient presents with   Fall    Brief Narrative:  Benjamin Boyle is a pleasant 76 y.o. male with past medical history of dementia, type 2 diabetes, hypertension  presented to the hospital after he was found down by EMS. The patient was found under his bed and may have been there for over 24 hours.   In the ED, patient was febrile with pulse ox of 90 on 2 L of supplemental oxygen.  He was noted to be tachycardic, tachypneic but had stable blood pressure.  EKG showed normal sinus rhythm.  CT head scan and C-spine was negative.  CT chest showed dense airspace consolidation in the right middle lobe and patchy patient in the right lower lobe suggestive of multifocal pneumonia.  Patient was then admitted hospital for further evaluation and treatment.   Assessment & Plan:   Principal Problem:   Multifocal pneumonia Active Problems:   Acute respiratory failure with hypoxia (HCC)   CAD (coronary artery disease)   Hypertension   Hypokalemia   Elevated transaminase level   Diabetes (HCC)   Duodenitis   Dementia (HCC)   Closed type III fracture of odontoid process, sequela   Sepsis (HCC)   Fall at home, initial encounter   Debility   Acute urinary retention  #1 sepsis secondary to multifocal pneumonia, POA -Patient presented noted to be febrile, tachycardic, hypoxic found to be down on the floor. -CT chest done concerning for multifocal pneumonia. -Urine strep pneumococcus antigen negative, urine Legionella antigen negative. -Blood cultures with no growth to date x 5 days. -Sputum Gram stain and cultures pending. -Influenza A/B PCR negative, SARS coronavirus 2 PCR negative. -Procalcitonin noted elevated at 2.09 trended down currently at 1.26. -Hypoxia improving currently with sats of 92-96 % on room air -Status post 5 days azithromycin.   -Transitioned from  IV Rocephin to oral Omnicef to complete a 7-day course of antibiotic treatment.  -Continue Incruse, Dulera.  Albuterol as needed.   -Supportive care.    2.  Acute hypoxic respiratory failure secondary to pneumonia -See problem #1. -Hypoxia improved with sats of 93-95% on room air. -IV antibiotics transitioned to oral antibiotics. -Continue Dulera, Spiriva, albuterol as needed.  3.  Fall/Debility -Unknown etiology -No acute trauma noted. -CT head CT C-spine with no acute abnormalities. -Patient seen by PT who are recommended SNF however family prefers to take patient home per PT note. -Spoke with patient's daughter who feels unsafe for patient to return home at this time and recommending SNF as patient noted to have increasing need for help with ADLs and ambulation. -TOC consult for SNF placement.  4.  Hypokalemia -Potassium at 3.8, magnesium at 2.0.   -Repeat labs in the AM.  5.  Diabetes mellitus type 2 -Last hemoglobin A1c 6.9% 07/2021. -CBG 112 this morning -SSI.  6.  History of odontoid process fracture -Patient noted to have suffered a type III odontoid process fracture in April followed by neurosurgery. -CT C-spine and head with stable findings noted. -Cervical collar changed to Aspen collar.    7.  Dementia -Supportive care.  8.  Transaminitis -Patient noted with elevated LFTs felt secondary to mild rhabdomyolysis. -CT abdomen and pelvis with no acute findings. -LFTs trended down with hydration.    9.  Mild rhabdomyolysis -Felt likely secondary to fall and found on the floor. -CK levels trended down  at 228 from 529 from 1831. -IV fluids discontinued -Supportive care.  10.  CKD stage II -Renal function stable. -IV fluids discontinued.  11.  Duodenitis -Duodenal wall thickening and surrounding edema noted in CT. -Abdominal exam benign. -Continue PPI. -Outpatient follow-up.  12.  Disposition -Spoke with patient's daughter on 12/26/2021, over the telephone,  who is concerned about patient's safety if patient was to discharge home with home health.  It is noted per patient's daughter that patient's wife just got out of the hospital and blind in 1 eye and due to patient's increased level of care unsure whether wife could potentially take care of patient in her current state and recommending possible SNF placement. -TOC informed of SNF placement request.      DVT prophylaxis: Lovenox Code Status: Full Family Communication: Updated patient.  No family at bedside. Disposition: SNF.  Status is: Inpatient Remains inpatient appropriate because: Unsafe disposition   Consultants:  None  Procedures:  CT head CT C-spine 12/21/2021 CT chest abdomen and pelvis 12/21/2021 CT L-spine 12/21/2021  Antimicrobials:  Oral azithromycin 12/23/2021>>>> 12/26/2021 IV azithromycin 12/21/2021>>>> 12/23/2021 IV Rocephin 12/21/2021>>>>>> 12/25/2021 Omnicef 12/25/2021>>>> 12/27/2021   Subjective: Laying in bed.  No chest pain.  No shortness of breath.  No abdominal pain.  Bouts of intermittent confusion.    Objective: Vitals:   12/27/21 2035 12/27/21 2132 12/28/21 0507 12/28/21 0748  BP:  128/80 (!) 148/72 134/82  Pulse:  78 60 (!) 50  Resp:  16 15 16   Temp:  97.8 F (36.6 C) 97.7 F (36.5 C) 98 F (36.7 C)  TempSrc:  Oral Oral Oral  SpO2: 93% 94% 95% 95%  Weight:      Height:        Intake/Output Summary (Last 24 hours) at 12/28/2021 1107 Last data filed at 12/28/2021 0900 Gross per 24 hour  Intake 723 ml  Output 3050 ml  Net -2327 ml    Filed Weights   12/22/21 1612  Weight: 62 kg    Examination:  General exam: No acute distress.  Cervical collar off. Respiratory system: Some coarse breath sounds in the bases bilaterally.  No wheezing.  Fair air movement.  Speaking in full sentences. Cardiovascular system: RRR no murmurs rubs or gallops.  No JVD.  No lower extremity edema.  Gastrointestinal system: Abdomen is soft, nontender, nondistended, positive bowel  sounds.  No rebound.  No guarding.   Central nervous system: Alert. No focal neurological deficits. Extremities: Symmetric 5 x 5 power. Skin: No rashes, lesions or ulcers Psychiatry: Judgement and insight appear poor to fair. Mood & affect appropriate.     Data Reviewed: I have personally reviewed following labs and imaging studies  CBC: Recent Labs  Lab 12/21/21 1600 12/21/21 1607 12/23/21 0156 12/24/21 0224 12/25/21 0138 12/26/21 0137 12/28/21 0202  WBC 7.4   < > 5.2 5.7 5.6 5.7 5.9  NEUTROABS 6.4  --   --   --   --   --   --   HGB 12.7*   < > 10.8* 12.8* 11.3* 11.6* 10.5*  HCT 38.2*   < > 31.1* 40.0 33.6* 35.0* 31.8*  MCV 91.2   < > 89.1 93.9 90.1 92.3 90.6  PLT 173   < > 153 170 206 238 343   < > = values in this interval not displayed.     Basic Metabolic Panel: Recent Labs  Lab 12/21/21 2154 12/22/21 02/22/22 12/23/21 02/23/22 12/24/21 0224 12/25/21 0138 12/26/21 02/26/22 12/27/21 0135 12/28/21 0202  NA  --  137   < > 139 139 140 139 139  K  --  3.2*   < > 4.1 3.1* 3.6 3.7 3.8  CL  --  106   < > 111 107 110 111 107  CO2  --  22   < > 20* 21* 22 23 21*  GLUCOSE  --  105*   < > 106* 147* 119* 98 107*  BUN  --  23   < > 21 16 12 15 17   CREATININE  --  1.12   < > 0.89 0.80 0.79 0.84 0.89  CALCIUM  --  8.2*   < > 8.1* 8.3* 8.2* 8.0* 8.1*  MG 2.1 2.1  --   --  1.7 2.0  --   --    < > = values in this interval not displayed.     GFR: Estimated Creatinine Clearance: 63.9 mL/min (by C-G formula based on SCr of 0.89 mg/dL).  Liver Function Tests: Recent Labs  Lab 12/21/21 1600 12/22/21 0347 12/23/21 0156 12/24/21 0224 12/25/21 0138  AST 149* 142* 103* 66* 55*  ALT 95* 96* 86* 69* 61*  ALKPHOS 50 43 41 49 53  BILITOT 0.9 0.5 0.5 0.4 0.7  PROT 5.6* 4.9* 4.2* 4.4* 4.4*  ALBUMIN 2.5* 2.1* 1.7* 1.7* 1.8*     CBG: Recent Labs  Lab 12/27/21 0902 12/27/21 1233 12/27/21 1615 12/27/21 2138 12/28/21 0751  GLUCAP 201* 134* 88 175* 112*      Recent Results  (from the past 240 hour(s))  Resp Panel by RT-PCR (Flu A&B, Covid) Anterior Nasal Swab     Status: None   Collection Time: 12/21/21  6:01 PM   Specimen: Anterior Nasal Swab  Result Value Ref Range Status   SARS Coronavirus 2 by RT PCR NEGATIVE NEGATIVE Final    Comment: (NOTE) SARS-CoV-2 target nucleic acids are NOT DETECTED.  The SARS-CoV-2 RNA is generally detectable in upper respiratory specimens during the acute phase of infection. The lowest concentration of SARS-CoV-2 viral copies this assay can detect is 138 copies/mL. A negative result does not preclude SARS-Cov-2 infection and should not be used as the sole basis for treatment or other patient management decisions. A negative result may occur with  improper specimen collection/handling, submission of specimen other than nasopharyngeal swab, presence of viral mutation(s) within the areas targeted by this assay, and inadequate number of viral copies(<138 copies/mL). A negative result must be combined with clinical observations, patient history, and epidemiological information. The expected result is Negative.  Fact Sheet for Patients:  EntrepreneurPulse.com.au  Fact Sheet for Healthcare Providers:  IncredibleEmployment.be  This test is no t yet approved or cleared by the [place] FDA and  has been authorized for detection and/or diagnosis of SARS-CoV-2 by FDA under an Emergency Use Authorization (EUA). This EUA will remain  in effect (meaning this test can be used) for the duration of the COVID-19 declaration under Section 564(b)(1) of the Act, 21 U.S.C.section 360bbb-3(b)(1), unless the authorization is terminated  or revoked sooner.       Influenza A by PCR NEGATIVE NEGATIVE Final   Influenza B by PCR NEGATIVE NEGATIVE Final    Comment: (NOTE) The Xpert Xpress SARS-CoV-2/FLU/RSV plus assay is intended as an aid in the diagnosis of influenza from Nasopharyngeal swab specimens  and should not be used as a sole basis for treatment. Nasal washings and aspirates are unacceptable for Xpert Xpress SARS-CoV-2/FLU/RSV testing.  Fact Sheet for Patients: EntrepreneurPulse.com.au  Fact  Sheet for Healthcare Providers: IncredibleEmployment.be  This test is not yet approved or cleared by the [place] and has been authorized for detection and/or diagnosis of SARS-CoV-2 by FDA under an Emergency Use Authorization (EUA). This EUA will remain in effect (meaning this test can be used) for the duration of the COVID-19 declaration under Section 564(b)(1) of the Act, 21 U.S.C. section 360bbb-3(b)(1), unless the authorization is terminated or revoked.  Performed at [facility], [place] [address], [place], [place] [address]   Blood culture (routine x 2)     Status: None   Collection Time: 12/21/21  6:17 PM   Specimen: BLOOD  Result Value Ref Range Status   Specimen Description BLOOD SITE NOT SPECIFIED  Final   Special Requests   Final    BOTTLES DRAWN AEROBIC AND ANAEROBIC Blood Culture adequate volume   Culture   Final    NO GROWTH 5 DAYS Performed at [facility], [address], [place], [place] [address]    Report Status 12/26/2021 FINAL  Final  Blood culture (routine x 2)     Status: None   Collection Time: 12/21/21  6:18 PM   Specimen: BLOOD  Result Value Ref Range Status   Specimen Description BLOOD SITE NOT SPECIFIED  Final   Special Requests   Final    BOTTLES DRAWN AEROBIC AND ANAEROBIC Blood Culture adequate volume   Culture   Final    NO GROWTH 5 DAYS Performed at [facility], [place] [address] [place], [place] [address]    Report Status 12/26/2021 FINAL  Final         Radiology Studies: No results found.      Scheduled Meds:  amLODipine  5 mg Oral Daily   clonazepam  1.25 mg Oral QHS   enoxaparin (LOVENOX) injection  40 mg Subcutaneous Q24H   insulin aspart  0-6 Units  Subcutaneous TID WC   memantine  20 mg Oral QHS   mometasone-formoterol  2 puff Inhalation BID   naproxen  500 mg Oral BID   pantoprazole  40 mg Oral Q24H   PARoxetine  60 mg Oral Daily   pramipexole  1 mg Oral QHS   prednisoLONE acetate  1 drop Left Eye BID   pregabalin  100 mg Oral BID   Ensure Max Protein   Oral Daily   rivastigmine  6 mg Oral BID WC   sodium chloride  1 drop Right Eye TID   sodium chloride flush  3 mL Intravenous Q12H   tamsulosin  0.4 mg Oral Daily   traZODone  50 mg Oral QHS   umeclidinium bromide  1 puff Inhalation Daily   vitamin B-12  500 mcg Oral Daily   Continuous Infusions:     LOS: 7 days    Time spent: 35 minutes    Irine Seal, MD [facility]   To contact the attending provider between 7A-7P or the covering provider during after hours 7P-7A, please log into the web site [email or web address] and access using universal [place] password for that web site. If you do not have the password, please call the hospital operator.  12/28/2021, 11:07 AM

## 2021-12-28 NOTE — TOC Progression Note (Addendum)
Transition of Care [facility]) - Progression Note    Patient Details  Name: TALLIE HEVIA MRN: [id number] Date of Birth: 04-06-1946  [facility] CM/SW Contact  Bess Kinds, RN Phone Number: [phone] 12/28/2021, 4:22 PM  Clinical Narrative:     Assisting CSW with SNF w/u.   PASRR [id number] A  FL2 complete. Faxed out to [employer] area.   TOC to follow for bed offers.   Expected Discharge Plan: Skilled Nursing Facility Barriers to Discharge: Continued Medical Work up, SNF Pending bed offer  Expected Discharge Plan and Services Expected Discharge Plan: Skilled Nursing Facility In-house Referral: Clinical Social Work Discharge Planning Services: CM Consult Post Acute Care Choice: Skilled Nursing Facility Living arrangements for the past 2 months: Single Family Home                           HH Arranged: PT   Date HH Agency Contacted: 12/23/21 Time HH Agency Contacted: 1516 Representative spoke with at [facility] Agency: Arline Asp   Social Determinants of Health (SDOH) Interventions    Readmission Risk Interventions     No data to display

## 2021-12-29 LAB — GLUCOSE, CAPILLARY
Glucose-Capillary: 109 mg/dL — ABNORMAL HIGH (ref 70–99)
Glucose-Capillary: 128 mg/dL — ABNORMAL HIGH (ref 70–99)
Glucose-Capillary: 123 mg/dL — ABNORMAL HIGH (ref 70–99)
Glucose-Capillary: 120 mg/dL — ABNORMAL HIGH (ref 70–99)

## 2021-12-29 NOTE — Progress Notes (Signed)
Spoke to patient's wife, Lurena Joiner, who states that she is the medical power of attorney and does not want patient's daughter to be given anymore information regarding patient. RN explained that we need to have a copy of this medical power of attorney, which wife agreed to bring in in the next day or two. Patient's wife is upset that she has not been contacted regarding her husband or his condition. RN updated Rebecca's best contact phone number in the emergency contact information. Will make social work and MD aware of conversation.

## 2021-12-29 NOTE — Progress Notes (Signed)
PROGRESS NOTE    Benjamin Boyle  [id number] DOB: August 01, 1945 DOA: 12/21/2021 PCP: [facility]    Chief Complaint  Patient presents with   Fall    Brief Narrative:  Benjamin Boyle is a pleasant 76 y.o. male with past medical history of dementia, type 2 diabetes, hypertension  presented to the hospital after he was found down by EMS. The patient was found under his bed and may have been there for over 24 hours.   In the ED, patient was febrile with pulse ox of 90 on 2 L of supplemental oxygen.  He was noted to be tachycardic, tachypneic but had stable blood pressure.  EKG showed normal sinus rhythm.  CT head scan and C-spine was negative.  CT chest showed dense airspace consolidation in the right middle lobe and patchy patient in the right lower lobe suggestive of multifocal pneumonia.  Patient was then admitted hospital for further evaluation and treatment.   Assessment & Plan:   Principal Problem:   Multifocal pneumonia Active Problems:   Acute respiratory failure with hypoxia (HCC)   CAD (coronary artery disease)   Hypertension   Hypokalemia   Elevated transaminase level   Diabetes (HCC)   Duodenitis   Dementia (HCC)   Closed type III fracture of odontoid process, sequela   Sepsis ([place])   Fall at home, initial encounter   Debility   Acute urinary retention  #1 sepsis secondary to multifocal pneumonia, POA -Patient presented noted to be febrile, tachycardic, hypoxic found to be down on the floor. -CT chest done was concerning for multifocal pneumonia. -Urine strep pneumococcus antigen negative, urine Legionella antigen negative. -Blood cultures with no growth to date x 5 days. -Sputum Gram stain and cultures pending. -Influenza A/B PCR negative, SARS coronavirus 2 PCR negative. -Procalcitonin noted elevated at 2.09 trended down currently at 1.26. -Hypoxia improving currently with sats of 93-99 % on room air -Status post 5 days azithromycin.   -Transitioned  from IV Rocephin to oral Omnicef and has completed a 7-day course of treatment. -Continue Incruse, Dulera.  Albuterol as needed.   -Supportive care.    2.  Acute hypoxic respiratory failure secondary to pneumonia -See problem #1. -Hypoxia improved with sats of 93-99% on room air. -IV antibiotics transitioned to oral antibiotics which patient has completed. -Continue Dulera, Spiriva, albuterol as needed.  3.  Fall/Debility -Unknown etiology -No acute trauma noted. -CT head CT C-spine with no acute abnormalities. -Patient seen by PT who are recommended SNF however family prefers to take patient home per PT note. -Spoke with patient's daughter a few days ago, who feels unsafe for patient to return home at this time and recommending SNF as patient noted to have increasing need for help with ADLs and ambulation. -Tried calling to speak with patient's wife however unable to be reached in a few days ago. -TOC consult for SNF placement.  4.  Hypokalemia -Potassium at 3.8, magnesium at 2.0.   -Repeat labs in the AM.  5.  Diabetes mellitus type 2 -Last hemoglobin A1c 6.9% 07/2021. -CBG 109 this morning -SSI.  6.  History of odontoid process fracture -Patient noted to have suffered a type III odontoid process fracture in April followed by neurosurgery. -CT C-spine and head with stable findings noted. -Cervical collar changed to Aspen collar.    7.  Dementia -Supportive care.  8.  Transaminitis -Patient noted with elevated LFTs felt secondary to mild rhabdomyolysis. -CT abdomen and pelvis with no acute findings. -LFTs  trended down with hydration.   -No further inpatient work-up needed at this time.  9.  Mild rhabdomyolysis -Felt likely secondary to fall and found on the floor. -CK levels trended down at 228 from 529 from 1831. -IV fluids discontinued -Supportive care.  10.  CKD stage II -Renal function stable. -IV fluids discontinued.  11.  Duodenitis -Duodenal wall thickening  and surrounding edema noted in CT. -Abdominal exam benign. -PPI.   -Outpatient follow-up.  12.  Disposition -Spoke with patient's daughter on 12/26/2021, over the telephone, who is concerned about patient's safety if patient was to discharge home with home health.  It is noted per patient's daughter that patient's wife just got out of the hospital and blind in 1 eye and due to patient's increased level of care unsure whether wife could potentially take care of patient in her current state and recommending possible SNF placement. -: Wife a few days ago however unable to be reached. -TOC informed of SNF placement request.      DVT prophylaxis: Lovenox Code Status: Full Family Communication: Updated patient.  No family at bedside. Disposition: Medically stable for discharge to SNF.  Status is: Inpatient Remains inpatient appropriate because: Unsafe disposition   Consultants:  None  Procedures:  CT head CT C-spine 12/21/2021 CT chest abdomen and pelvis 12/21/2021 CT L-spine 12/21/2021  Antimicrobials:  Oral azithromycin 12/23/2021>>>> 12/26/2021 IV azithromycin 12/21/2021>>>> 12/23/2021 IV Rocephin 12/21/2021>>>>>> 12/25/2021 Omnicef 12/25/2021>>>> 12/27/2021   Subjective: Laying in bed.  No chest pain.  No shortness of breath.  No abdominal pain.    Objective: Vitals:   12/28/21 0748 12/28/21 1938 12/29/21 0451 12/29/21 0731  BP: 134/82 (!) 160/90 (!) 147/80 (!) 166/90  Pulse: (!) 50 70 71 60  Resp: 16 18 18 16   Temp: 98 F (36.7 C) (!) 97.4 F (36.3 C) 97.6 F (36.4 C) 98 F (36.7 C)  TempSrc: Oral Oral  Oral  SpO2: 95% 96% 93% 96%  Weight:      Height:        Intake/Output Summary (Last 24 hours) at 12/29/2021 1232 Last data filed at 12/29/2021 0800 Gross per 24 hour  Intake 720 ml  Output 1800 ml  Net -1080 ml    Filed Weights   12/22/21 1612  Weight: 62 kg    Examination:  General exam: No acute distress.  Cervical collar on. Respiratory system: CTA B anterior lung  fields.  No wheezes, no crackles, no rhonchi.  Fair air movement. Cardiovascular system: Regular rate rhythm no murmurs rubs or gallops.  No JVD.  No lower extremity edema.   Gastrointestinal system: Abdomen is soft, nontender, nondistended, positive bowel sounds.  No rebound.  No guarding.   Central nervous system: Alert. No focal neurological deficits. Extremities: Symmetric 5 x 5 power. Skin: No rashes, lesions or ulcers Psychiatry: Judgement and insight appear poor to fair. Mood & affect appropriate.     Data Reviewed: I have personally reviewed following labs and imaging studies  CBC: Recent Labs  Lab 12/23/21 0156 12/24/21 0224 12/25/21 0138 12/26/21 0137 12/28/21 0202  WBC 5.2 5.7 5.6 5.7 5.9  HGB 10.8* 12.8* 11.3* 11.6* 10.5*  HCT 31.1* 40.0 33.6* 35.0* 31.8*  MCV 89.1 93.9 90.1 92.3 90.6  PLT 153 170 206 238 343     Basic Metabolic Panel: Recent Labs  Lab 12/24/21 0224 12/25/21 0138 12/26/21 0137 12/27/21 0135 12/28/21 0202  NA 139 139 140 139 139  K 4.1 3.1* 3.6 3.7 3.8  CL 111 107  110 111 107  CO2 20* 21* 22 23 21*  GLUCOSE 106* 147* 119* 98 107*  BUN 21 16 12 15 17   CREATININE 0.89 0.80 0.79 0.84 0.89  CALCIUM 8.1* 8.3* 8.2* 8.0* 8.1*  MG  --  1.7 2.0  --   --      GFR: Estimated Creatinine Clearance: 63.9 mL/min (by C-G formula based on SCr of 0.89 mg/dL).  Liver Function Tests: Recent Labs  Lab 12/23/21 0156 12/24/21 0224 12/25/21 0138  AST 103* 66* 55*  ALT 86* 69* 61*  ALKPHOS 41 49 53  BILITOT 0.5 0.4 0.7  PROT 4.2* 4.4* 4.4*  ALBUMIN 1.7* 1.7* 1.8*     CBG: Recent Labs  Lab 12/28/21 1241 12/28/21 1553 12/28/21 2015 12/29/21 0733 12/29/21 1156  GLUCAP 110* 129* 123* 109* 128*      Recent Results (from the past 240 hour(s))  Resp Panel by RT-PCR (Flu A&B, Covid) Anterior Nasal Swab     Status: None   Collection Time: 12/21/21  6:01 PM   Specimen: Anterior Nasal Swab  Result Value Ref Range Status   SARS Coronavirus 2  by RT PCR NEGATIVE NEGATIVE Final    Comment: (NOTE) SARS-CoV-2 target nucleic acids are NOT DETECTED.  The SARS-CoV-2 RNA is generally detectable in upper respiratory specimens during the acute phase of infection. The lowest concentration of SARS-CoV-2 viral copies this assay can detect is 138 copies/mL. A negative result does not preclude SARS-Cov-2 infection and should not be used as the sole basis for treatment or other patient management decisions. A negative result may occur with  improper specimen collection/handling, submission of specimen other than nasopharyngeal swab, presence of viral mutation(s) within the areas targeted by this assay, and inadequate number of viral copies(<138 copies/mL). A negative result must be combined with clinical observations, patient history, and epidemiological information. The expected result is Negative.  Fact Sheet for Patients:  EntrepreneurPulse.com.au  Fact Sheet for Healthcare Providers:  IncredibleEmployment.be  This test is no t yet approved or cleared by the [place] FDA and  has been authorized for detection and/or diagnosis of SARS-CoV-2 by FDA under an Emergency Use Authorization (EUA). This EUA will remain  in effect (meaning this test can be used) for the duration of the COVID-19 declaration under Section 564(b)(1) of the Act, 21 U.S.C.section 360bbb-3(b)(1), unless the authorization is terminated  or revoked sooner.       Influenza A by PCR NEGATIVE NEGATIVE Final   Influenza B by PCR NEGATIVE NEGATIVE Final    Comment: (NOTE) The Xpert Xpress SARS-CoV-2/FLU/RSV plus assay is intended as an aid in the diagnosis of influenza from Nasopharyngeal swab specimens and should not be used as a sole basis for treatment. Nasal washings and aspirates are unacceptable for Xpert Xpress SARS-CoV-2/FLU/RSV testing.  Fact Sheet for Patients: EntrepreneurPulse.com.au  Fact  Sheet for Healthcare Providers: IncredibleEmployment.be  This test is not yet approved or cleared by the [place] FDA and has been authorized for detection and/or diagnosis of SARS-CoV-2 by FDA under an Emergency Use Authorization (EUA). This EUA will remain in effect (meaning this test can be used) for the duration of the COVID-19 declaration under Section 564(b)(1) of the Act, 21 U.S.C. section 360bbb-3(b)(1), unless the authorization is terminated or revoked.  Performed at [facility], [place] [address] [place], [place] [address]   Blood culture (routine x 2)     Status: None   Collection Time: 12/21/21  6:17 PM   Specimen: BLOOD  Result Value Ref Range Status   Specimen Description BLOOD SITE NOT SPECIFIED  Final   Special Requests   Final    BOTTLES DRAWN AEROBIC AND ANAEROBIC Blood Culture adequate volume   Culture   Final    NO GROWTH 5 DAYS Performed at [facility], [address], [place], [place] [address]    Report Status 12/26/2021 FINAL  Final  Blood culture (routine x 2)     Status: None   Collection Time: 12/21/21  6:18 PM   Specimen: BLOOD  Result Value Ref Range Status   Specimen Description BLOOD SITE NOT SPECIFIED  Final   Special Requests   Final    BOTTLES DRAWN AEROBIC AND ANAEROBIC Blood Culture adequate volume   Culture   Final    NO GROWTH 5 DAYS Performed at [facility], [address] [place], [place] [address]    Report Status 12/26/2021 FINAL  Final         Radiology Studies: No results found.      Scheduled Meds:  amLODipine  5 mg Oral Daily   clonazepam  1.25 mg Oral QHS   enoxaparin (LOVENOX) injection  40 mg Subcutaneous Q24H   insulin aspart  0-6 Units Subcutaneous TID WC   memantine  20 mg Oral QHS   mometasone-formoterol  2 puff Inhalation BID   naproxen  500 mg Oral BID   pantoprazole  40 mg Oral Q24H   PARoxetine  60 mg Oral Daily   pramipexole  1 mg Oral QHS   prednisoLONE  acetate  1 drop Left Eye BID   pregabalin  100 mg Oral BID   Ensure Max Protein   Oral Daily   rivastigmine  6 mg Oral BID WC   sodium chloride  1 drop Right Eye TID   sodium chloride flush  3 mL Intravenous Q12H   tamsulosin  0.4 mg Oral Daily   traZODone  50 mg Oral QHS   umeclidinium bromide  1 puff Inhalation Daily   vitamin B-12  500 mcg Oral Daily   Continuous Infusions:     LOS: 8 days    Time spent: 35 minutes    Ramiro Harvest, MD [facility]   To contact the attending provider between 7A-7P or the covering provider during after hours 7P-7A, please log into the web site [email or web address] and access using universal [place] password for that web site. If you do not have the password, please call the hospital operator.  12/29/2021, 12:32 PM

## 2021-12-29 NOTE — TOC Progression Note (Addendum)
Transition of Care [facility]) - Progression Note    Patient Details  Name: Benjamin Boyle MRN: [id number] Date of Birth: 22-Apr-1946  Transition of Care [facility]) CM/SW Contact  Lorri Frederick, LCSW Phone Number: 12/29/2021, 4:03 PM  Clinical Narrative:   CSW LM with daughter Victorino Dike about bed offers.   1610: Daughter called back, not happy with [facility] option, would like to consider [facility] options, referral sent to [facility].  Expected Discharge Plan: Skilled Nursing Facility Barriers to Discharge: Continued Medical Work up, SNF Pending bed offer  Expected Discharge Plan and Services Expected Discharge Plan: Skilled Nursing Facility In-house Referral: Clinical Social Work Discharge Planning Services: CM Consult Post Acute Care Choice: Skilled Nursing Facility Living arrangements for the past 2 months: Single Family Home                           HH Arranged: PT   Date HH Agency Contacted: 12/23/21 Time HH Agency Contacted: 1516 Representative spoke with at [facility] Agency: Arline Asp   Social Determinants of Health (SDOH) Interventions    Readmission Risk Interventions     No data to display

## 2021-12-29 NOTE — Plan of Care (Signed)
No acute events overnight.    Problem: Activity: Goal: Ability to tolerate increased activity will improve Outcome: Progressing   Problem: Clinical Measurements: Goal: Ability to maintain a body temperature in the normal range will improve Outcome: Progressing   Problem: Respiratory: Goal: Ability to maintain adequate ventilation will improve Outcome: Progressing Goal: Ability to maintain a clear airway will improve Outcome: Progressing   Problem: Education: Goal: Ability to describe self-care measures that may prevent or decrease complications (Diabetes Survival Skills Education) will improve Outcome: Progressing Goal: Individualized Educational Video(s) Outcome: Progressing   Problem: Coping: Goal: Ability to adjust to condition or change in health will improve Outcome: Progressing   Problem: Fluid Volume: Goal: Ability to maintain a balanced intake and output will improve Outcome: Progressing   Problem: Health Behavior/Discharge Planning: Goal: Ability to identify and utilize available resources and services will improve Outcome: Progressing Goal: Ability to manage health-related needs will improve Outcome: Progressing   Problem: Metabolic: Goal: Ability to maintain appropriate glucose levels will improve Outcome: Progressing   Problem: Nutritional: Goal: Maintenance of adequate nutrition will improve Outcome: Progressing Goal: Progress toward achieving an optimal weight will improve Outcome: Progressing   Problem: Skin Integrity: Goal: Risk for impaired skin integrity will decrease Outcome: Progressing   Problem: Tissue Perfusion: Goal: Adequacy of tissue perfusion will improve Outcome: Progressing   Problem: Education: Goal: Knowledge of General Education information will improve Description: Including pain rating scale, medication(s)/side effects and non-pharmacologic comfort measures Outcome: Progressing   Problem: Health Behavior/Discharge  Planning: Goal: Ability to manage health-related needs will improve Outcome: Progressing   Problem: Clinical Measurements: Goal: Ability to maintain clinical measurements within normal limits will improve Outcome: Progressing Goal: Will remain free from infection Outcome: Progressing Goal: Diagnostic test results will improve Outcome: Progressing Goal: Respiratory complications will improve Outcome: Progressing Goal: Cardiovascular complication will be avoided Outcome: Progressing   Problem: Activity: Goal: Risk for activity intolerance will decrease Outcome: Progressing   Problem: Nutrition: Goal: Adequate nutrition will be maintained Outcome: Progressing   Problem: Coping: Goal: Level of anxiety will decrease Outcome: Progressing   Problem: Elimination: Goal: Will not experience complications related to bowel motility Outcome: Progressing Goal: Will not experience complications related to urinary retention Outcome: Progressing   Problem: Pain Managment: Goal: General experience of comfort will improve Outcome: Progressing   Problem: Safety: Goal: Ability to remain free from injury will improve Outcome: Progressing   Problem: Skin Integrity: Goal: Risk for impaired skin integrity will decrease Outcome: Progressing   Problem: Education: Goal: Knowledge of General Education information will improve Description: Including pain rating scale, medication(s)/side effects and non-pharmacologic comfort measures Outcome: Progressing   Problem: Health Behavior/Discharge Planning: Goal: Ability to manage health-related needs will improve Outcome: Progressing   Problem: Clinical Measurements: Goal: Ability to maintain clinical measurements within normal limits will improve Outcome: Progressing Goal: Will remain free from infection Outcome: Progressing Goal: Diagnostic test results will improve Outcome: Progressing Goal: Respiratory complications will  improve Outcome: Progressing Goal: Cardiovascular complication will be avoided Outcome: Progressing   Problem: Activity: Goal: Risk for activity intolerance will decrease Outcome: Progressing   Problem: Nutrition: Goal: Adequate nutrition will be maintained Outcome: Progressing   Problem: Coping: Goal: Level of anxiety will decrease Outcome: Progressing   Problem: Elimination: Goal: Will not experience complications related to bowel motility Outcome: Progressing Goal: Will not experience complications related to urinary retention Outcome: Progressing   Problem: Pain Managment: Goal: General experience of comfort will improve Outcome: Progressing   Problem: Safety: Goal: Ability  to remain free from injury will improve Outcome: Progressing   Problem: Skin Integrity: Goal: Risk for impaired skin integrity will decrease Outcome: Progressing

## 2021-12-30 LAB — BASIC METABOLIC PANEL
Anion gap: 8 (ref 5–15)
BUN: 21 mg/dL (ref 8–23)
CO2: 23 mmol/L (ref 22–32)
Calcium: 8.7 mg/dL — ABNORMAL LOW (ref 8.9–10.3)
Chloride: 107 mmol/L (ref 98–111)
Creatinine, Ser: 1.02 mg/dL (ref 0.61–1.24)
GFR, Estimated: >60 mL/min (ref >60)
Glucose, Bld: 149 mg/dL — ABNORMAL HIGH (ref 70–99)
Potassium: 3.7 mmol/L (ref 3.5–5.1)
Sodium: 138 mmol/L (ref 135–145)

## 2021-12-30 LAB — GLUCOSE, CAPILLARY
Glucose-Capillary: 100 mg/dL — ABNORMAL HIGH (ref 70–99)
Glucose-Capillary: 169 mg/dL — ABNORMAL HIGH (ref 70–99)

## 2021-12-30 MED ORDER — PANTOPRAZOLE SODIUM 40 MG PO TBEC: 40.0000 mg | DELAYED_RELEASE_TABLET | ORAL | Status: AC

## 2021-12-30 MED ORDER — CLONAZEPAM 0.5 MG PO TABS: 1.2500 mg | ORAL_TABLET | Freq: Every day | ORAL | 0 refills | Status: AC

## 2021-12-30 NOTE — Evaluation (Signed)
Occupational Therapy Evaluation Patient Details Name: Benjamin Boyle MRN: [id number] DOB: 1945-07-26 Today's Date: 12/30/2021   History of Present Illness 76 y.o. M presenting 12/21/2021 after he was found down.  EMS reported that patient was found under his bed and they thought he may have been down for 24 hours. Pt admitted with sepsis due to pneumonia. PMH: dementia, type 2 diabetes mellitus, hypertension, and odontoid fracture.   Clinical Impression   PT admitted with PNA with noted odontoid fx prior admission. Pt currently with functional limitiations due to the deficits listed below (see OT problem list). Pt requires stedy with two person (A) for transfers. Pt provided spirometer during session with mod cues to utilize but able to follow commands. Recommend continued upright spirometer use with staff.  Pt will benefit from skilled OT to increase their independence and safety with adls and balance to allow discharge SNF.       Recommendations for follow up therapy are one component of a multi-disciplinary discharge planning process, led by the attending physician.  Recommendations may be updated based on patient status, additional functional criteria and insurance authorization.   Follow Up Recommendations  Skilled nursing-short term rehab (<3 hours/day)    Assistance Recommended at Discharge Frequent or constant Supervision/Assistance  Patient can return home with the following Two people to help with walking and/or transfers;Two people to help with bathing/dressing/bathroom;Assist for transportation    Functional Status Assessment  Patient has had a recent decline in their functional status and demonstrates the ability to make significant improvements in function in a reasonable and predictable amount of time.  Equipment Recommendations  Wheelchair (measurements OT);Wheelchair cushion (measurements OT);Hospital bed    Recommendations for Other Services       Precautions /  Restrictions Precautions Precautions: Fall;Cervical Required Braces or Orthoses: Cervical Brace Cervical Brace: Hard collar;At all times      Mobility Bed Mobility Overal bed mobility: Needs Assistance Bed Mobility: Rolling, Supine to Sit Rolling: Max assist   Supine to sit: +2 for physical assistance, Max assist     General bed mobility comments: pt requires 1 step cues, pt requires pad use to roll toward the L. pt requires (A) to elevate trunk from bed to sitting. pt unable to sustain static sitting. pt with posterior lean. pt attempts righting reaction but unable to sustain.    Transfers Overall transfer level: Needs assistance Equipment used:  (stedy) Transfers: Sit to/from Stand Sit to Stand: +2 physical assistance, Mod assist, From elevated surface           General transfer comment: pt is able to power up with strong posterior bias      Balance Overall balance assessment: Needs assistance Sitting-balance support: Bilateral upper extremity supported, Feet supported Sitting balance-Leahy Scale: Zero   Postural control: Posterior lean Standing balance support: Bilateral upper extremity supported, During functional activity, Reliant on assistive device for balance Standing balance-Leahy Scale: Zero                             ADL either performed or assessed with clinical judgement   ADL Overall ADL's : Needs assistance/impaired Eating/Feeding: Sitting;Moderate assistance Eating/Feeding Details (indicate cue type and reason): needs (A) to feed due to grasp and hand to mouth Grooming: Wash/dry face;Oral care;Moderate assistance;Sitting   Upper Body Bathing: Moderate assistance   Lower Body Bathing: Maximal assistance   Upper Body Dressing : Moderate assistance   Lower Body Dressing: Maximal assistance  Toilet Transfer: +2 for physical assistance;Moderate assistance (stedy with (A) for trunk control)                   Vision          Perception     Praxis      Pertinent Vitals/Pain Pain Assessment Pain Assessment: Faces Faces Pain Scale: Hurts even more Pain Location: L ankle pain / L rib pain Pain Descriptors / Indicators: Grimacing Pain Intervention(s): Monitored during session, Repositioned     Hand Dominance Right   Extremity/Trunk Assessment Upper Extremity Assessment Upper Extremity Assessment: Generalized weakness;RUE deficits/detail;LUE deficits/detail RUE Deficits / Details: decreased shoulder flexion, 3 out 5 grasp, unable to reach hand to mouth RUE Sensation: decreased light touch RUE Coordination: decreased fine motor;decreased gross motor LUE Deficits / Details: decreased shoulder flexion, 3 out 5 grasp, unable to reach hand to mouth LUE Sensation: decreased light touch LUE Coordination: decreased fine motor;decreased gross motor   Lower Extremity Assessment Lower Extremity Assessment: Defer to PT evaluation       Communication Communication Communication: No difficulties   Cognition Arousal/Alertness: Awake/alert Behavior During Therapy: Flat affect Overall Cognitive Status: History of cognitive impairments - at baseline                                 General Comments: able to follow 1 step commands with increased time. Likes to watch crime TV does not like game shows     General Comments  monitor skin for break down in ccollar    Exercises     Shoulder Instructions      Home Living Family/patient expects to be discharged to:: Skilled nursing facility                                 Additional Comments: wife at rehab and reported to RN 7/11 to have all communication to her as she is POA      Prior Functioning/Environment Prior Level of Function : History of Falls (last six months);Independent/Modified Independent             Mobility Comments: Uses rollator per pt's wife. ADLs Comments: Pt reports independence with basic ADL's. Pt's DTR  takes care of his errands for him and helps clean his house for him. Pt does not cook. Pt's DTR takes care of his medications.        OT Problem List: Decreased activity tolerance;Impaired balance (sitting and/or standing);Decreased cognition;Decreased safety awareness;Decreased knowledge of use of DME or AE;Decreased knowledge of precautions;Impaired sensation;Impaired UE functional use;Decreased range of motion;Decreased strength;Decreased coordination      OT Treatment/Interventions: Self-care/ADL training;Therapeutic exercise;Neuromuscular education;Energy conservation;DME and/or AE instruction;Manual therapy;Modalities;Therapeutic activities;Cognitive remediation/compensation;Patient/family education;Balance training    OT Goals(Current goals can be found in the care plan section) Acute Rehab OT Goals Patient Stated Goal: feels good to sit up OT Goal Formulation: Patient unable to participate in goal setting Time For Goal Achievement: 01/13/22 Potential to Achieve Goals: Good  OT Frequency: Min 2X/week    Co-evaluation PT/OT/SLP Co-Evaluation/Treatment: Yes Reason for Co-Treatment: For patient/therapist safety;To address functional/ADL transfers;Necessary to address cognition/behavior during functional activity   OT goals addressed during session: ADL's and self-care;Proper use of Adaptive equipment and DME;Strengthening/ROM      AM-PAC OT "6 Clicks" Daily Activity     Outcome Measure Help from another person eating meals?: A Lot Help  from another person taking care of personal grooming?: A Lot Help from another person toileting, which includes using toliet, bedpan, or urinal?: A Lot Help from another person bathing (including washing, rinsing, drying)?: A Lot Help from another person to put on and taking off regular upper body clothing?: A Lot Help from another person to put on and taking off regular lower body clothing?: A Lot 6 Click Score: 12   End of Session Equipment  Utilized During Treatment: Gait belt;Cervical collar (stedy) Nurse Communication: Mobility status;Precautions  Activity Tolerance: Patient tolerated treatment well Patient left: in chair;with call bell/phone within reach;with chair alarm set  OT Visit Diagnosis: Unsteadiness on feet (R26.81);Muscle weakness (generalized) (M62.81);Pain Pain - Right/Left: Left Pain - part of body: Leg;Ankle and joints of foot                Time: 0932-3557 OT Time Calculation (min): 23 min Charges:  OT General Charges $OT Visit: 1 Visit OT Evaluation $OT Eval Moderate Complexity: 1 Mod   Brynn, OTR/L  Acute Rehabilitation Services Office: [phone] .   Mateo Flow 12/30/2021, 10:54 AM

## 2021-12-30 NOTE — Progress Notes (Signed)
Physical Therapy Treatment Patient Details Name: Benjamin Boyle MRN: [id number] DOB: 11-11-1945 Today's Date: 12/30/2021   History of Present Illness 76 y.o. M presenting 12/21/2021 after he was found down.  EMS reported that patient was found under his bed and they thought he may have been down for 24 hours. Pt admitted with sepsis due to pneumonia. PMH: dementia, type 2 diabetes mellitus, hypertension, and odontoid fracture.    PT Comments    Pt with improved alertness and command following this session. Pt requiring two person assist for bed mobility and transfers. Utilized Stedy (lift equipment) to transition from bed to chair due to strong posterior bias. Adjusted Aspen cervical collar for optimal fit. Continue to recommend SNF for ongoing Physical Therapy.      Recommendations for follow up therapy are one component of a multi-disciplinary discharge planning process, led by the attending physician.  Recommendations may be updated based on patient status, additional functional criteria and insurance authorization.  Follow Up Recommendations  Skilled nursing-short term rehab (<3 hours/day) Can patient physically be transported by private vehicle: No   Assistance Recommended at Discharge Frequent or constant Supervision/Assistance  Patient can return home with the following Two people to help with walking and/or transfers;A lot of help with bathing/dressing/bathroom;Help with stairs or ramp for entrance;Assist for transportation;Direct supervision/assist for medications management;Assistance with cooking/housework   Equipment Recommendations  BSC/3in1;Hospital bed;Other (comment) (hoyer lift)    Recommendations for Other Services       Precautions / Restrictions Precautions Precautions: Fall;Cervical Required Braces or Orthoses: Cervical Brace Cervical Brace: Hard collar;At all times Restrictions Weight Bearing Restrictions: No     Mobility  Bed Mobility Overal bed mobility:  Needs Assistance Bed Mobility: Rolling, Supine to Sit Rolling: Max assist   Supine to sit: +2 for physical assistance, Max assist     General bed mobility comments: pt requires 1 step cues, pt requires pad use to roll toward the L. pt requires (A) to elevate trunk from bed to sitting. pt unable to sustain static sitting. pt with posterior lean. pt attempts righting reaction but unable to sustain.    Transfers Overall transfer level: Needs assistance Equipment used:  (stedy) Transfers: Sit to/from Stand Sit to Stand: +2 physical assistance, Mod assist, From elevated surface           General transfer comment: pt is able to power up with strong posterior bias. Stedy transfer from bed to chair Transfer via Lift Equipment: Stedy  Ambulation/Gait                   Stairs             Wheelchair Mobility    Modified Rankin (Stroke Patients Only)       Balance Overall balance assessment: Needs assistance Sitting-balance support: Bilateral upper extremity supported, Feet supported Sitting balance-Leahy Scale: Poor   Postural control: Posterior lean Standing balance support: Bilateral upper extremity supported, During functional activity, Reliant on assistive device for balance Standing balance-Leahy Scale: Zero                              Cognition Arousal/Alertness: Awake/alert Behavior During Therapy: Flat affect Overall Cognitive Status: History of cognitive impairments - at baseline                                 General Comments: able to follow  1 step commands with increased time. Likes to watch crime TV does not like game shows        Exercises      General Comments General comments (skin integrity, edema, etc.): monitor skin for break down in ccollar      Pertinent Vitals/Pain Pain Assessment Pain Assessment: Faces Faces Pain Scale: Hurts even more Pain Location: L ankle pain / L rib pain Pain Descriptors /  Indicators: Grimacing Pain Intervention(s): Monitored during session    Home Living Family/patient expects to be discharged to:: Skilled nursing facility                   Additional Comments: wife at rehab and reported to RN 7/11 to have all communication to her as she is POA    Prior Function            PT Goals (current goals can now be found in the care plan section) Acute Rehab PT Goals Patient Stated Goal: none stated Potential to Achieve Goals: Fair Progress towards PT goals: Progressing toward goals    Frequency    Min 3X/week      PT Plan Current plan remains appropriate    Co-evaluation PT/OT/SLP Co-Evaluation/Treatment: Yes Reason for Co-Treatment: For patient/therapist safety;Necessary to address cognition/behavior during functional activity;To address functional/ADL transfers PT goals addressed during session: Mobility/safety with mobility OT goals addressed during session: ADL's and self-care;Proper use of Adaptive equipment and DME;Strengthening/ROM      AM-PAC PT "6 Clicks" Mobility   Outcome Measure  Help needed turning from your back to your side while in a flat bed without using bedrails?: A Lot Help needed moving from lying on your back to sitting on the side of a flat bed without using bedrails?: Total Help needed moving to and from a bed to a chair (including a wheelchair)?: Total Help needed standing up from a chair using your arms (e.g., wheelchair or bedside chair)?: Total Help needed to walk in hospital room?: Total Help needed climbing 3-5 steps with a railing? : Total 6 Click Score: 7    End of Session Equipment Utilized During Treatment: Gait belt;Cervical collar Activity Tolerance: Patient tolerated treatment well Patient left: in chair;with call bell/phone within reach;with chair alarm set;Other (comment) (posey chair alarm belt) Nurse Communication: Mobility status PT Visit Diagnosis: Other abnormalities of gait and mobility  (R26.89);History of falling (Z91.81);Muscle weakness (generalized) (M62.81)     Time: 4196-2229 PT Time Calculation (min) (ACUTE ONLY): 23 min  Charges:  $Therapeutic Activity: 8-22 mins                     Benjamin Boyle, PT, DPT Acute Rehabilitation Services Office [phone]    Benjamin Boyle 12/30/2021, 11:33 AM

## 2021-12-30 NOTE — TOC Progression Note (Addendum)
Transition of Care [facility]) - Progression Note    Patient Details  Name: Benjamin Boyle MRN: [id number] Date of Birth: June 20, 1946  Transition of Care [facility]) CM/SW Contact  Lorri Frederick, LCSW Phone Number: 12/30/2021, 11:10 AM  Clinical Narrative:   CSW spoke with pt wife Lurena Joiner. She reports that she is POA and would like to make decisions for her husband rather than her step daughter.  CSW discussed bed offers and she would like to accept offer at [facility].   She will speak to her step daughter and inform her of this.  CSW confirmed with Kim/[facility] that they can accept pt.   CSW spoke with [facility].  Pt ZW#[id number].  Auth request submitted in [employer], immediately approved: [id number], 3 days: 7/12-7/14.  MD notified.   1130: CSW spoke with wife, informed her that pt will DC today.  1145: TC from pt daughter Victorino Dike, tearful, stating pt wife "does not have his best interests at heart", does not want pt to to to [facility].  Victorino Dike confirmed that pt wife is POA, she asked if there was a process to get "emergency POA".  Discussed that wife is the decision maker in this case, Victorino Dike upset but verbalized understanding.      Expected Discharge Plan: Skilled Nursing Facility Barriers to Discharge: Continued Medical Work up, SNF Pending bed offer  Expected Discharge Plan and Services Expected Discharge Plan: Skilled Nursing Facility In-house Referral: Clinical Social Work Discharge Planning Services: CM Consult Post Acute Care Choice: Skilled Nursing Facility Living arrangements for the past 2 months: Single Family Home                           HH Arranged: PT   Date HH Agency Contacted: 12/23/21 Time HH Agency Contacted: 1516 Representative spoke with at [facility] Agency: Arline Asp   Social Determinants of Health (SDOH) Interventions    Readmission Risk Interventions     No data to display

## 2021-12-30 NOTE — TOC Transition Note (Signed)
Transition of Care [facility]) - CM/SW Discharge Note   Patient Details  Name: Benjamin Boyle MRN: [id number] Date of Birth: 1945/08/15  Transition of Care [facility]) CM/SW Contact:  Lorri Frederick, LCSW Phone Number: 12/30/2021, 12:13 PM   Clinical Narrative:  Pt discharging to [facility] [place].  RN call report to [phone].       Final next level of care: Skilled Nursing Facility Barriers to Discharge: Barriers Resolved   Patient Goals and CMS Choice   CMS Medicare.gov Compare Post Acute Care list provided to:: Patient Represenative (must comment) (daughter Victorino Dike) Choice offered to / list presented to : Adult Children  Discharge Placement              Patient chooses bed at:  [facility]) Patient to be transferred to facility by: [facility] Name of family member notified: wife Lurena Joiner Patient and family notified of of transfer: 12/30/21  Discharge Plan and Services In-house Referral: Clinical Social Work Discharge Planning Services: CM Consult Post Acute Care Choice: Skilled Nursing Facility                    HH Arranged: PT   Date HH Agency Contacted: 12/23/21 Time HH Agency Contacted: 1516 Representative spoke with at [facility] Agency: Arline Asp  Social Determinants of Health (SDOH) Interventions     Readmission Risk Interventions     No data to display

## 2021-12-30 NOTE — Discharge Summary (Signed)
Physician Discharge Summary  Benjamin Boyle FGH:[id number] DOB: [birth year] DOA: 12/21/2021  PCP: [facility]  Admit date: 12/21/2021 Discharge date: 12/30/2021 Recommendations for Outpatient Follow-up:  Follow up with PCP in 1 weeks-call for appointment Please obtain BMP/CBC in one week  Discharge Dispo: SNF Discharge Condition: Stable Code Status:   Code Status: Full Code Diet recommendation:  Diet Order             Diet regular Room service appropriate? Yes; Fluid consistency: Thin  Diet effective now                    Brief/Interim Summary: 76 y.o. male with past medical history of dementia, type 2 diabetes, hypertension  presented to the hospital after he was found down by EMS. The patient was found under his bed and may have been there for over 24 hours.   In the ED, patient was febrile with pulse ox of 90 on 2 L of supplemental oxygen.  He was noted to be tachycardic, tachypneic but had stable blood pressure.  EKG showed normal sinus rhythm.  CT head scan and C-spine was negative.  CT chest showed dense airspace consolidation in the right middle lobe and patchy patient in the right lower lobe suggestive of multifocal pneumonia.  Patient was then admitted hospital for further evaluation and treatment. Treated for multifocal pneumonia.  Unable to reach family/S/P on multiple attempts.  Patient was treated with antibiotics and completed, at this time waiting for placement to skilled nursing facility. At this time patient is medically stable for discharge to skilled nursing facility, [facility] has been obtained and will be discharged once wife agreeable. Social worker has reached out to wife and she is agreeable for discharge to skilled nursing facility today.  Discharge Diagnoses:  Principal Problem:   Multifocal pneumonia Active Problems:   Acute respiratory failure with hypoxia (HCC)   CAD (coronary artery disease)   Hypertension   Hypokalemia   Elevated transaminase  level   Diabetes (HCC)   Duodenitis   Dementia (HCC)   Closed type III fracture of odontoid process, sequela   Sepsis (HCC)   Fall at home, initial encounter   Debility   Acute urinary retention  Sepsis secondary to multifocal pneumonia,POA: Resolved.  CT chest concerning for multifocal pneumonia.  Work-up negative-Urine strep pneumococcus antigen Legionella antigen negative, Blood cultures with no growth to date x 5 days.  Patient completed 5 days of azithromycin and also Omnicef x7 days, managed with bronchodilators antitussives.  At this time doing well on room air.  Acute hypoxic respiratory failure secondary to pneumonia Resolved.  Fall/Debility/deconditioning -In the setting of patient's pneumonia only multiple comorbidities, seated CT spine no acute finding, seen by PT OT and planning for skilled nursing facility.   Hypokalemia-Resolved  Diabetes mellitus type 2:Last hemoglobin A1c 6.9% 07/2021.Stable History of odontoid process fracture:Patient noted to have suffered a type III odontoid process fracture in April followed by neurosurgeryCT C-spine and head with stable findings noted.Cervical collar changed to Aspen collar.   Dementia:Mood stable, continue  supportive care. Hypertension blood pressure control continue amlodipine.Discontinue losartan. Transaminitis:Patient noted with elevated LFTs felt secondary to mild rhabdomyolysis.CT abdomen and pelvis with no acute findings.LFTs trended down with hydration.No further inpatient work-up needed at this time. Mild rhabdomyolysis: Resolved  CKD stage CV:ELFYB function stable.  Duodenitis:Duodenal wall thickening and surrounding edema noted in CT.Abdominal exam benign. Cont PPI.    Consults: PT OT, social worker Subjective: Alert awake pleasant, working with  PT OT, asking nursing facility.  Discharge Exam: Vitals:   12/30/21 0756 12/30/21 0822  BP: 136/84   Pulse: 68   Resp: 16   Temp: 97.8 F (36.6 C)   SpO2: 96% 95%    General: Pt is alert, awake, not in acute distress Cardiovascular: RRR, S1/S2 +, no rubs, no gallops Respiratory: CTA bilaterally, no wheezing, no rhonchi Abdominal: Soft, NT, ND, bowel sounds + Extremities: no edema, no cyanosis  Discharge Instructions  Discharge Instructions     Discharge instructions   Complete by: As directed    Please call call MD or return to ER for similar or worsening recurring problem that brought you to hospital or if any fever,nausea/vomiting,abdominal pain, uncontrolled pain, chest pain,  shortness of breath or any other alarming symptoms.  Please follow-up your doctor as instructed in a week time and call the office for appointment.  Please avoid alcohol, smoking, or any other illicit substance and maintain healthy habits including taking your regular medications as prescribed.  You were cared for by a hospitalist during your hospital stay. If you have any questions about your discharge medications or the care you received while you were in the hospital after you are discharged, you can call the unit and ask to speak with the hospitalist on call if the hospitalist that took care of you is not available.  Once you are discharged, your primary care physician will handle any further medical issues. Please note that NO REFILLS for any discharge medications will be authorized once you are discharged, as it is imperative that you return to your primary care physician (or establish a relationship with a primary care physician if you do not have one) for your aftercare needs so that they can reassess your need for medications and monitor your lab values   Increase activity slowly   Complete by: As directed       Allergies as of 12/30/2021       Reactions   Codeine Nausea And Vomiting   Aricept [donepezil] Diarrhea   Glipizide Other (See Comments)   Hypoglycemia    Neurontin [gabapentin] Other (See Comments)   Memory impairment   Zomig [zolmitriptan] Other  (See Comments)   Unknown reaction        Medication List     STOP taking these medications    oxyCODONE 5 MG immediate release tablet Commonly known as: Oxy IR/ROXICODONE       TAKE these medications    acetaminophen 650 MG CR tablet Commonly known as: TYLENOL Take 1,300 mg by mouth daily as needed for pain.   Alogliptin Benzoate 25 MG Tabs Take 12.5 mg by mouth daily.   amLODipine 10 MG tablet Commonly known as: NORVASC Take 5 mg by mouth daily.   clonazePAM 0.5 MG tablet Commonly known as: KLONOPIN Take 2.5 tablets (1.25 mg total) by mouth at bedtime for 5 doses.   ENSURE HIGH PROTEIN PO Take 1-2 Bottles by mouth daily.   losartan 100 MG tablet Commonly known as: COZAAR Take 100 mg by mouth daily.   memantine 10 MG tablet Commonly known as: NAMENDA Take 20 mg by mouth at bedtime.   MENTHOL-METHYL SALICYLATE EX Apply 1 Application topically every 4 (four) hours as needed (neck pain). Menthol/M-Salicylate 10-15%   naproxen 500 MG tablet Commonly known as: NAPROSYN Take 500 mg by mouth 2 (two) times daily.   pantoprazole 40 MG tablet Commonly known as: PROTONIX Take 1 tablet (40 mg total) by mouth daily.  PARoxetine 40 MG tablet Commonly known as: PAXIL Take 60 mg by mouth daily.   pramipexole 1 MG tablet Commonly known as: MIRAPEX Take 1 mg by mouth at bedtime.   prednisoLONE acetate 1 % ophthalmic suspension Commonly known as: PRED FORTE Place 1 drop into the left eye 2 (two) times daily.   pregabalin 100 MG capsule Commonly known as: LYRICA Take 100 mg by mouth 2 (two) times daily.   rivastigmine 6 MG capsule Commonly known as: EXELON Take 6 mg by mouth 2 (two) times daily with a meal.   sildenafil 100 MG tablet Commonly known as: VIAGRA Take 50 mg by mouth daily as needed for erectile dysfunction.   sodium chloride 5 % ophthalmic solution Commonly known as: MURO 128 Place 1 drop into the right eye in the morning, at noon, in the  evening, and at bedtime.   sodium chloride 5 % ophthalmic ointment Commonly known as: MURO 0000000 Place 1 Application into both eyes 2 (two) times daily.   tamsulosin 0.4 MG Caps capsule Commonly known as: FLOMAX Take 0.4 mg by mouth daily.   traZODone 50 MG tablet Commonly known as: DESYREL Take 50 mg by mouth at bedtime.   vitamin B-12 500 MCG tablet Commonly known as: CYANOCOBALAMIN Take 500 mcg by mouth daily.        Contact information for follow-up providers     [facility] Follow up.   Contact information: [place] [address] [phone]              Contact information for after-discharge care     Destination     [facility] .   Service: Skilled Nursing Contact information: [address] [place] [address] [phone]                    Allergies  Allergen Reactions   Codeine Nausea And Vomiting   Aricept [Donepezil] Diarrhea   Glipizide Other (See Comments)    Hypoglycemia    Neurontin [Gabapentin] Other (See Comments)    Memory impairment   Zomig [Zolmitriptan] Other (See Comments)    Unknown reaction    The results of significant diagnostics from this hospitalization (including imaging, microbiology, ancillary and laboratory) are listed below for reference.    Microbiology: Recent Results (from the past 240 hour(s))  Resp Panel by RT-PCR (Flu A&B, Covid) Anterior Nasal Swab     Status: None   Collection Time: 12/21/21  6:01 PM   Specimen: Anterior Nasal Swab  Result Value Ref Range Status   SARS Coronavirus 2 by RT PCR NEGATIVE NEGATIVE Final    Comment: (NOTE) SARS-CoV-2 target nucleic acids are NOT DETECTED.  The SARS-CoV-2 RNA is generally detectable in upper respiratory specimens during the acute phase of infection. The lowest concentration of SARS-CoV-2 viral copies this assay can detect is 138 copies/mL. A negative result  does not preclude SARS-Cov-2 infection and should not be used as the sole basis for treatment or other patient management decisions. A negative result may occur with  improper specimen collection/handling, submission of specimen other than nasopharyngeal swab, presence of viral mutation(s) within the areas targeted by this assay, and inadequate number of viral copies(<138 copies/mL). A negative result must be combined with clinical observations, patient history, and epidemiological information. The expected result is Negative.  Fact Sheet for Patients:  EntrepreneurPulse.com.au  Fact Sheet for Healthcare Providers:  IncredibleEmployment.be  This test is no t yet approved  or cleared by the [place] and  has been authorized for detection and/or diagnosis of SARS-CoV-2 by FDA under an Emergency Use Authorization (EUA). This EUA will remain  in effect (meaning this test can be used) for the duration of the COVID-19 declaration under Section 564(b)(1) of the Act, 21 U.S.C.section 360bbb-3(b)(1), unless the authorization is terminated  or revoked sooner.       Influenza A by PCR NEGATIVE NEGATIVE Final   Influenza B by PCR NEGATIVE NEGATIVE Final    Comment: (NOTE) The Xpert Xpress SARS-CoV-2/FLU/RSV plus assay is intended as an aid in the diagnosis of influenza from Nasopharyngeal swab specimens and should not be used as a sole basis for treatment. Nasal washings and aspirates are unacceptable for Xpert Xpress SARS-CoV-2/FLU/RSV testing.  Fact Sheet for Patients: EntrepreneurPulse.com.au  Fact Sheet for Healthcare Providers: IncredibleEmployment.be  This test is not yet approved or cleared by the [place] FDA and has been authorized for detection and/or diagnosis of SARS-CoV-2 by FDA under an Emergency Use Authorization (EUA). This EUA will remain in effect (meaning this test can be used) for  the duration of the COVID-19 declaration under Section 564(b)(1) of the Act, 21 U.S.C. section 360bbb-3(b)(1), unless the authorization is terminated or revoked.  Performed at [facility], [place] [address], [place], [place] [address]   Blood culture (routine x 2)     Status: None   Collection Time: 12/21/21  6:17 PM   Specimen: BLOOD  Result Value Ref Range Status   Specimen Description BLOOD SITE NOT SPECIFIED  Final   Special Requests   Final    BOTTLES DRAWN AEROBIC AND ANAEROBIC Blood Culture adequate volume   Culture   Final    NO GROWTH 5 DAYS Performed at [facility], [address] [place], [place] [address]    Report Status 12/26/2021 FINAL  Final  Blood culture (routine x 2)     Status: None   Collection Time: 12/21/21  6:18 PM   Specimen: BLOOD  Result Value Ref Range Status   Specimen Description BLOOD SITE NOT SPECIFIED  Final   Special Requests   Final    BOTTLES DRAWN AEROBIC AND ANAEROBIC Blood Culture adequate volume   Culture   Final    NO GROWTH 5 DAYS Performed at [facility], [place] [address] [place], [place] [address]    Report Status 12/26/2021 FINAL  Final    Procedures/Studies: CT L-SPINE NO CHARGE  Result Date: 12/21/2021 CLINICAL DATA:  [place].  Back pain. EXAM: CT LUMBAR SPINE WITHOUT CONTRAST TECHNIQUE: Multidetector CT imaging of the lumbar spine was performed without intravenous contrast administration. Multiplanar CT image reconstructions were also generated. RADIATION DOSE REDUCTION: This exam was performed according to the departmental dose-optimization program which includes automated exposure control, adjustment of the mA and/or kV according to patient size and/or use of iterative reconstruction technique. COMPARISON:  02/16/2011 FINDINGS: Segmentation: 5 lumbar type vertebral bodies. Alignment: No malalignment. Vertebrae: No fracture in the region from inferior T11 through S4. Distant decompression and fusion at the L3-4 level  with pedicle screws and posterior rods. Paraspinal and other soft tissues: Negative Disc levels: No significant disc level pathology at T11-12 or T12-L1. L1-2: Circumferential disc bulge. Mild stenosis of both lateral recesses. L2-3: Circumferential disc bulge. Facet and ligamentous hypertrophy. Multifactorial spinal stenosis that could be significant. L3-4: Previous fusion procedure. Sufficient patency of the canal and foramina. L4-5: Previous posterior decompression. Circumferential disc protrusion. Potential for significant  stenosis at this level. L5-S1: Circumferential disc protrusion more prominent towards the right. Facet arthropathy worse on the right. Stenosis of the lateral recesses and foramina right more than left that could be significant. IMPRESSION: No acute or traumatic finding in the region from inferior T11 through S4. Distant fusion procedure at L3-4 has a satisfactory appearance. Severe multifactorial stenosis at L2-3 and L4-5 that could be significant. Lateral recess and foraminal stenosis right worse than left at L5-S1 that could be significant. Electronically Signed   By: Nelson Chimes M.D.   On: 12/21/2021 18:01   CT CHEST ABDOMEN PELVIS W CONTRAST  Result Date: 12/21/2021 CLINICAL DATA:  Fall.  Head trauma.  Poly trauma.  Found down. EXAM: CT CHEST, ABDOMEN, AND PELVIS WITH CONTRAST TECHNIQUE: Multidetector CT imaging of the chest, abdomen and pelvis was performed following the standard protocol during bolus administration of intravenous contrast. RADIATION DOSE REDUCTION: This exam was performed according to the departmental dose-optimization program which includes automated exposure control, adjustment of the mA and/or kV according to patient size and/or use of iterative reconstruction technique. CONTRAST:  188mL OMNIPAQUE IOHEXOL 300 MG/ML  SOLN COMPARISON:  None Available. FINDINGS: CT CHEST FINDINGS Cardiovascular: The heart size is normal. No substantial pericardial effusion. Coronary  artery calcification is evident. Moderate atherosclerotic calcification is noted in the wall of the thoracic aorta. Mediastinum/Nodes: 11 mm short axis precarinal lymph node is borderline enlarged. Adjacent upper normal subcarinal lymph nodes measure up to 10 mm short axis. There is no hilar lymphadenopathy. the esophagus has normal imaging features. There is no axillary lymphadenopathy. Lungs/Pleura: Centrilobular and paraseptal emphysema evident. Dense airspace consolidation identified in the right middle lobe with patchy and nodular airspace disease identified in the right lower lobe including 3.2 cm nodular component on image 101/6. No suspicious pulmonary nodule or mass in the left lung. Tiny right pleural effusion evident. Musculoskeletal: No worrisome lytic or sclerotic osseous abnormality. 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 CT ABDOMEN PELVIS FINDINGS Hepatobiliary: No suspicious focal abnormality within the liver parenchyma. Gallbladder is nondistended with potential trace pericholecystic fluid. No intrahepatic or extrahepatic biliary dilation. Pancreas: No focal mass lesion. No dilatation of the main duct. No intraparenchymal cyst. No peripancreatic edema. Spleen: No splenomegaly. No focal mass lesion. Adrenals/Urinary Tract: Thickening of the adrenal glands bilaterally is compatible with hyperplasia. 2.7 cm exophytic cyst noted upper pole left kidney no suspicious enhancing mass lesion identified in either kidney. No evidence for hydroureter. Bladder is distended. Stomach/Bowel: Stomach is unremarkable. No gastric wall thickening. No evidence of outlet obstruction. Duodenal wall appears thickened along the descending and proximal transverse segment with edema/fluid adjacent. No small bowel wall thickening. No small bowel dilatation. The terminal ileum is normal. The appendix is normal. No gross colonic mass. No colonic wall thickening. Vascular/Lymphatic: There  is moderate atherosclerotic calcification of the abdominal aorta without aneurysm. There is no gastrohepatic or hepatoduodenal ligament lymphadenopathy. No retroperitoneal or mesenteric lymphadenopathy. No pelvic sidewall lymphadenopathy. Reproductive: The prostate gland and seminal vesicles are unremarkable. Other: Trace free fluid is seen along the inferior liver. Edema seen in the retroperitoneal tissues bilaterally and in the presacral space. Musculoskeletal: No worrisome lytic or sclerotic osseous abnormality. Lumbar fusion hardware evident. IMPRESSION: 1. Dense airspace consolidation in the right middle lobe with patchy and nodular airspace disease in the right lower lobe. Imaging features are most suggestive of multifocal pneumonia. Given the nodular character of the airspace disease in the right lower lobe, close follow-up recommended to ensure  resolution. 2. Borderline mediastinal lymphadenopathy, likely reactive. 3. Wall thickening along the descending and proximal transverse segment of the duodenum with edema/fluid adjacent to the duodenum. Imaging features may be related to an infectious/inflammatory duodenitis. Peptic ulcer disease not excluded. 4. Trace free fluid along the inferior liver. 5. Edema in the retroperitoneal tissues bilaterally and in the presacral space. This is nonspecific and may be related to systemic disease. 6. Distended urinary bladder. 7. 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 8. Aortic Atherosclerosis (ICD10-I70.0) and Emphysema (ICD10-J43.9). Electronically Signed   By: Misty Stanley M.D.   On: 12/21/2021 17:43   CT Head Wo Contrast  Result Date: 12/21/2021 CLINICAL DATA:  Neck trauma.  Fall. EXAM: CT HEAD WITHOUT CONTRAST CT CERVICAL SPINE WITHOUT CONTRAST TECHNIQUE: Multidetector CT imaging of the head and cervical spine was performed following the standard protocol without intravenous contrast. Multiplanar CT image reconstructions of the cervical spine were  also generated. RADIATION DOSE REDUCTION: This exam was performed according to the departmental dose-optimization program which includes automated exposure control, adjustment of the mA and/or kV according to patient size and/or use of iterative reconstruction technique. COMPARISON:  CT examination dated September 30, 2021 FINDINGS: CT HEAD FINDINGS Brain: No evidence of acute infarction, hemorrhage, hydrocephalus, extra-axial collection or mass lesion/mass effect. Mega cisterna magna. Moderate cerebral volume loss and chronic microvascular ischemic changes of the white matter. Left occipital encephalomalacia suggesting prior infarct, unchanged. Vascular: No hyperdense vessel or unexpected calcification. Skull: Normal. Negative for fracture or focal lesion. Sinuses/Orbits: Patchy opacification of the ethmoid air cells. Other: None. CT CERVICAL SPINE FINDINGS Alignment: Unchanged anterolisthesis of dense secondary to the fracture. Reversal of normal cervical lordosis. Mild retrolisthesis of C5. Skull base and vertebrae: Unchanged appearance of the type 3 dens fracture with asymmetric extension into the lateral mass of C2 with mild anterior subluxation. Soft tissues and spinal canal: No prevertebral fluid or swelling. No visible canal hematoma. Disc levels: Advanced degenerative disc disease most prominent at C4-C5, C5-C6, unchanged. Moderate spinal canal and neural foraminal stenosis bilaterally at these levels. Upper chest: Emphysematous changes with pleural/parenchymal scarring. Other: None IMPRESSION: CT head: 1.  No acute intracranial abnormality. 2. 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 CT cervical spine: 1. Unchanged appearance of the type 3 odontoid process fracture. No new fracture. 2. 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 No significant soft tissue abnormality or  significant interval change. Electronically Signed   By: Keane Police D.O.   On: 12/21/2021 17:32   CT Cervical Spine Wo Contrast  Result Date: 12/21/2021 CLINICAL DATA:  Neck trauma.  Fall. EXAM: CT HEAD WITHOUT CONTRAST CT CERVICAL SPINE WITHOUT CONTRAST TECHNIQUE: Multidetector CT imaging of the head and cervical spine was performed following the standard protocol without intravenous contrast. Multiplanar CT image reconstructions of the cervical spine were also generated. RADIATION DOSE REDUCTION: This exam was performed according to the departmental dose-optimization program which includes automated exposure control, adjustment of the mA and/or kV according to patient size and/or use of iterative reconstruction technique. COMPARISON:  CT examination dated September 30, 2021 FINDINGS: CT HEAD FINDINGS Brain: No evidence of acute infarction, hemorrhage, hydrocephalus, extra-axial collection or mass lesion/mass effect. Mega cisterna magna. Moderate cerebral volume loss and chronic microvascular ischemic changes of the white matter. Left occipital encephalomalacia suggesting prior infarct, unchanged. Vascular: No hyperdense vessel or unexpected calcification. Skull: Normal. Negative for fracture or focal  lesion. Sinuses/Orbits: Patchy opacification of the ethmoid air cells. Other: None. CT CERVICAL SPINE FINDINGS Alignment: Unchanged anterolisthesis of dense secondary to the fracture. Reversal of normal cervical lordosis. Mild retrolisthesis of C5. Skull base and vertebrae: Unchanged appearance of the type 3 dens fracture with asymmetric extension into the lateral mass of C2 with mild anterior subluxation. Soft tissues and spinal canal: No prevertebral fluid or swelling. No visible canal hematoma. Disc levels: Advanced degenerative disc disease most prominent at C4-C5, C5-C6, unchanged. Moderate spinal canal and neural foraminal stenosis bilaterally at these levels. Upper chest: Emphysematous changes with  pleural/parenchymal scarring. Other: None IMPRESSION: CT head: 1.  No acute intracranial abnormality. 2. 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 CT cervical spine: 1. Unchanged appearance of the type 3 odontoid process fracture. No new fracture. 2. 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 No significant soft tissue abnormality or significant interval change. Electronically Signed   By: Larose Hires D.O.   On: 12/21/2021 17:32    Labs: BNP (last 3 results) No results for input(s): "BNP" in the last 8760 hours. Basic Metabolic Panel: Recent Labs  Lab 12/25/21 0138 12/26/21 0137 12/27/21 0135 12/28/21 0202 12/30/21 0132  NA 139 140 139 139 138  K 3.1* 3.6 3.7 3.8 3.7  CL 107 110 111 107 107  CO2 21* 22 23 21* 23  GLUCOSE 147* 119* 98 107* 149*  BUN 16 12 15 17 21   CREATININE 0.80 0.79 0.84 0.89 1.02  CALCIUM 8.3* 8.2* 8.0* 8.1* 8.7*  MG 1.7 2.0  --   --   --    Liver Function Tests: Recent Labs  Lab 12/24/21 0224 12/25/21 0138  AST 66* 55*  ALT 69* 61*  ALKPHOS 49 53  BILITOT 0.4 0.7  PROT 4.4* 4.4*  ALBUMIN 1.7* 1.8*   No results for input(s): "LIPASE", "AMYLASE" in the last 168 hours. No results for input(s): "AMMONIA" in the last 168 hours. CBC: Recent Labs  Lab 12/24/21 0224 12/25/21 0138 12/26/21 0137 12/28/21 0202  WBC 5.7 5.6 5.7 5.9  HGB 12.8* 11.3* 11.6* 10.5*  HCT 40.0 33.6* 35.0* 31.8*  MCV 93.9 90.1 92.3 90.6  PLT 170 206 238 343   Cardiac Enzymes: Recent Labs  Lab 12/25/21 0138  CKTOTAL 228   BNP: Invalid input(s): "POCBNP" CBG: Recent Labs  Lab 12/29/21 1156 12/29/21 1741 12/29/21 1937 12/30/21 0734 12/30/21 1111  GLUCAP 128* 123* 120* 100* 169*   D-Dimer No results for input(s): "DDIMER" in the last 72 hours. Hgb A1c No results for input(s): "HGBA1C" in the last 72 hours. Lipid Profile No  results for input(s): "CHOL", "HDL", "LDLCALC", "TRIG", "CHOLHDL", "LDLDIRECT" in the last 72 hours. Thyroid function studies No results for input(s): "TSH", "T4TOTAL", "T3FREE", "THYROIDAB" in the last 72 hours.  Invalid input(s): "FREET3" Anemia work up No results for input(s): "VITAMINB12", "FOLATE", "FERRITIN", "TIBC", "IRON", "RETICCTPCT" in the last 72 hours. Urinalysis    Component Value Date/Time   COLORURINE AMBER (A) 12/21/2021 2231   APPEARANCEUR HAZY (A) 12/21/2021 2231   LABSPEC 1.039 (H) 12/21/2021 2231   PHURINE 5.0 12/21/2021 2231   GLUCOSEU NEGATIVE 12/21/2021 2231   HGBUR MODERATE (A) 12/21/2021 2231   BILIRUBINUR NEGATIVE 12/21/2021 2231   KETONESUR 5 (A) 12/21/2021 2231   PROTEINUR 100 (A) 12/21/2021 2231   NITRITE NEGATIVE 12/21/2021 2231   LEUKOCYTESUR NEGATIVE 12/21/2021 2231   Sepsis Labs Recent  Labs  Lab 12/24/21 0224 12/25/21 0138 12/26/21 0137 12/28/21 0202  WBC 5.7 5.6 5.7 5.9   Microbiology Recent Results (from the past 240 hour(s))  Resp Panel by RT-PCR (Flu A&B, Covid) Anterior Nasal Swab     Status: None   Collection Time: 12/21/21  6:01 PM   Specimen: Anterior Nasal Swab  Result Value Ref Range Status   SARS Coronavirus 2 by RT PCR NEGATIVE NEGATIVE Final    Comment: (NOTE) SARS-CoV-2 target nucleic acids are NOT DETECTED.  The SARS-CoV-2 RNA is generally detectable in upper respiratory specimens during the acute phase of infection. The lowest concentration of SARS-CoV-2 viral copies this assay can detect is 138 copies/mL. A negative result does not preclude SARS-Cov-2 infection and should not be used as the sole basis for treatment or other patient management decisions. A negative result may occur with  improper specimen collection/handling, submission of specimen other than nasopharyngeal swab, presence of viral mutation(s) within the areas targeted by this assay, and inadequate number of viral copies(<138 copies/mL). A negative  result must be combined with clinical observations, patient history, and epidemiological information. The expected result is Negative.  Fact Sheet for Patients:  EntrepreneurPulse.com.au  Fact Sheet for Healthcare Providers:  IncredibleEmployment.be  This test is no t yet approved or cleared by the [place] FDA and  has been authorized for detection and/or diagnosis of SARS-CoV-2 by FDA under an Emergency Use Authorization (EUA). This EUA will remain  in effect (meaning this test can be used) for the duration of the COVID-19 declaration under Section 564(b)(1) of the Act, 21 U.S.C.section 360bbb-3(b)(1), unless the authorization is terminated  or revoked sooner.       Influenza A by PCR NEGATIVE NEGATIVE Final   Influenza B by PCR NEGATIVE NEGATIVE Final    Comment: (NOTE) The Xpert Xpress SARS-CoV-2/FLU/RSV plus assay is intended as an aid in the diagnosis of influenza from Nasopharyngeal swab specimens and should not be used as a sole basis for treatment. Nasal washings and aspirates are unacceptable for Xpert Xpress SARS-CoV-2/FLU/RSV testing.  Fact Sheet for Patients: EntrepreneurPulse.com.au  Fact Sheet for Healthcare Providers: IncredibleEmployment.be  This test is not yet approved or cleared by the [place] FDA and has been authorized for detection and/or diagnosis of SARS-CoV-2 by FDA under an Emergency Use Authorization (EUA). This EUA will remain in effect (meaning this test can be used) for the duration of the COVID-19 declaration under Section 564(b)(1) of the Act, 21 U.S.C. section 360bbb-3(b)(1), unless the authorization is terminated or revoked.  Performed at [facility], [place] [address] [place], [place] [address]   Blood culture (routine x 2)     Status: None   Collection Time: 12/21/21  6:17 PM   Specimen: BLOOD  Result Value Ref Range Status   Specimen Description  BLOOD SITE NOT SPECIFIED  Final   Special Requests   Final    BOTTLES DRAWN AEROBIC AND ANAEROBIC Blood Culture adequate volume   Culture   Final    NO GROWTH 5 DAYS Performed at [facility], [address] [place], [place] [address]    Report Status 12/26/2021 FINAL  Final  Blood culture (routine x 2)     Status: None   Collection Time: 12/21/21  6:18 PM   Specimen: BLOOD  Result Value Ref Range Status   Specimen Description BLOOD SITE NOT SPECIFIED  Final   Special Requests   Final    BOTTLES DRAWN AEROBIC AND ANAEROBIC Blood  Culture adequate volume   Culture   Final    NO GROWTH 5 DAYS Performed at [facility], [place] [address] [place], [place] [address]    Report Status 12/26/2021 FINAL  Final     Time coordinating discharge: 25 minutes  SIGNED: Antonieta Pert, MD  [facility] 12/30/2021, 11:25 AM  If 7PM-7AM, please contact night-coverage [email or web address]

## 2022-01-01 ENCOUNTER — Other Ambulatory Visit: Payer: PRIVATE HEALTH INSURANCE

## 2022-02-10 ENCOUNTER — Encounter: Payer: Self-pay | Admitting: Hematology and Oncology

## 2023-01-10 DIAGNOSIS — R079 Chest pain, unspecified: Secondary | ICD-10-CM | POA: Diagnosis not present
